# Patient Record
Sex: Female | Born: 1975 | Race: White | Hispanic: No | Marital: Single | Smoking: Never smoker
Health system: Southern US, Community
[De-identification: ages and names within clinical notes are randomized; demographics above are authoritative.]

## PROBLEM LIST (undated history)

## (undated) DIAGNOSIS — K589 Irritable bowel syndrome without diarrhea: Secondary | ICD-10-CM

## (undated) DIAGNOSIS — R112 Nausea with vomiting, unspecified: Secondary | ICD-10-CM

## (undated) DIAGNOSIS — Z9889 Other specified postprocedural states: Secondary | ICD-10-CM

## (undated) DIAGNOSIS — K219 Gastro-esophageal reflux disease without esophagitis: Secondary | ICD-10-CM

## (undated) DIAGNOSIS — L989 Disorder of the skin and subcutaneous tissue, unspecified: Secondary | ICD-10-CM

## (undated) DIAGNOSIS — F419 Anxiety disorder, unspecified: Secondary | ICD-10-CM

## (undated) HISTORY — DX: Gastro-esophageal reflux disease without esophagitis: K21.9

## (undated) HISTORY — PX: TUBAL LIGATION: SHX77

---

## 1999-11-24 ENCOUNTER — Other Ambulatory Visit: Admission: RE | Admit: 1999-11-24 | Discharge: 1999-12-19 | Payer: Self-pay | Admitting: Family Medicine

## 2000-12-26 ENCOUNTER — Other Ambulatory Visit: Admission: RE | Admit: 2000-12-26 | Discharge: 2000-12-26 | Payer: Self-pay | Admitting: *Deleted

## 2000-12-26 ENCOUNTER — Other Ambulatory Visit: Admission: RE | Admit: 2000-12-26 | Discharge: 2000-12-26 | Payer: Self-pay | Admitting: Family Medicine

## 2002-01-27 ENCOUNTER — Other Ambulatory Visit: Admission: RE | Admit: 2002-01-27 | Discharge: 2002-01-27 | Payer: Self-pay | Admitting: Family Medicine

## 2002-05-24 ENCOUNTER — Emergency Department (HOSPITAL_COMMUNITY): Admission: EM | Admit: 2002-05-24 | Discharge: 2002-05-24 | Payer: Self-pay | Admitting: *Deleted

## 2003-02-02 ENCOUNTER — Other Ambulatory Visit: Admission: RE | Admit: 2003-02-02 | Discharge: 2003-02-02 | Payer: Self-pay | Admitting: Family Medicine

## 2003-03-02 ENCOUNTER — Ambulatory Visit (HOSPITAL_COMMUNITY): Admission: RE | Admit: 2003-03-02 | Discharge: 2003-03-02 | Payer: Self-pay | Admitting: Family Medicine

## 2003-03-02 ENCOUNTER — Encounter: Payer: Self-pay | Admitting: Family Medicine

## 2003-03-11 ENCOUNTER — Ambulatory Visit (HOSPITAL_COMMUNITY): Admission: RE | Admit: 2003-03-11 | Discharge: 2003-03-11 | Payer: Self-pay | Admitting: Family Medicine

## 2003-03-11 ENCOUNTER — Encounter: Payer: Self-pay | Admitting: Family Medicine

## 2003-04-12 ENCOUNTER — Ambulatory Visit (HOSPITAL_COMMUNITY): Admission: RE | Admit: 2003-04-12 | Discharge: 2003-04-12 | Payer: Self-pay | Admitting: Internal Medicine

## 2003-04-12 ENCOUNTER — Encounter: Payer: Self-pay | Admitting: Internal Medicine

## 2003-07-19 ENCOUNTER — Other Ambulatory Visit: Admission: RE | Admit: 2003-07-19 | Discharge: 2003-07-19 | Payer: Self-pay | Admitting: Family Medicine

## 2004-05-09 ENCOUNTER — Ambulatory Visit (HOSPITAL_COMMUNITY): Admission: RE | Admit: 2004-05-09 | Discharge: 2004-05-09 | Payer: Self-pay | Admitting: Internal Medicine

## 2007-06-03 ENCOUNTER — Ambulatory Visit: Payer: Self-pay | Admitting: Internal Medicine

## 2008-04-02 DIAGNOSIS — K219 Gastro-esophageal reflux disease without esophagitis: Secondary | ICD-10-CM

## 2008-04-02 DIAGNOSIS — K589 Irritable bowel syndrome without diarrhea: Secondary | ICD-10-CM

## 2011-04-24 NOTE — Assessment & Plan Note (Signed)
[facility]                         GASTROENTEROLOGY OFFICE NOTE   NAME:Alyssa Arellano                     MRN:          [id number]  DATE:06/03/2007                            DOB:          [birth year]    HISTORY:  Alyssa Arellano presents today for followup.  She has not been in  this office for a little over three years.  She has a history of  functional GI complaints, including irritable bowel syndrome.  Also a  history of reflux disease and problems with anxiety.  She was last  evaluated on May 01, 2004 for a nonspecific nausea.  A solid phase  gastric emptying scan was normal.  She has not been seen since.   For her reflux, she continues on Prevacid 30 mg daily, occasionally  twice daily.  On medication, no significant symptoms.  Off medication,  significant breakthrough.  No dysphagia.  No vomiting.   Her lower abdominal complaints and change in bowel habits have been  stable as long as she uses Librax.  She generally uses this before  meals, occasionally at night.  She requests a refill of both GI  medications.  Since her last visit, she has had a cesarean section and a  tubal ligation.  No other problems.  She had been on antidepressants but  has not been for some time.   PAST MEDICAL HISTORY:  As above.   PAST SURGICAL HISTORY:  1. Cesarean section.  2. Tubal ligation.   ALLERGIES:  No known drug allergies.   CURRENT MEDICATIONS:  1. Prevacid 30 mg once or twice daily.  2. Librax 1 p.o. q.a.c. and nightly p.r.n.   FAMILY HISTORY:  Negative for gastrointestinal malignancy or  inflammatory bowel disease.   SOCIAL HISTORY:  Patient is single with two children.  Lives with her  boyfriend and children.  She is a Futures trader.  Does not smoke or use  alcohol.   REVIEW OF SYSTEMS:  Per diagnostic evaluation form.   PHYSICAL EXAMINATION:  GENERAL:  A well-appearing female in no acute  distress.  VITAL SIGNS:  Blood pressure is 108/74, heart  rate 72.  Weight is 198  pounds.  She is 5 feet 3 inches in height.  HEENT:  Sclerae are anicteric.  Conjunctivae are pink.  Oral mucosa is  intact.  NECK:  No adenopathy.  LUNGS:  Clear.  HEART:  Regular.  ABDOMEN:  Soft without tenderness, mass, or hernia.  Good bowel sounds  heard.   IMPRESSION:  1. Gastroesophageal reflux disease without alarm symptoms.  The      symptoms controlled on proton pump inhibitor therapy.  2. Irritable bowel syndrome:  Stable on Librax.   RECOMMENDATIONS:  1. Continue Prevacid.  2. Continue Librax.  3. Resume general medical care with Dr. Morrie Sheldon.  4. GI followup p.r.n.     Wilhemina Bonito. Marina Goodell, MD  Electronically Signed    JNP/MedQ  DD: 06/03/2007  DT: 06/03/2007  Job #: [id number]   cc:   Alfredia Client, MD

## 2011-12-03 ENCOUNTER — Encounter: Payer: Self-pay | Admitting: *Deleted

## 2011-12-03 ENCOUNTER — Emergency Department (HOSPITAL_COMMUNITY): Payer: Medicaid Other

## 2011-12-03 ENCOUNTER — Emergency Department (HOSPITAL_COMMUNITY)
Admission: EM | Admit: 2011-12-03 | Discharge: 2011-12-03 | Disposition: A | Discharge status: Home / Self Care | Payer: Medicaid Other | Attending: Emergency Medicine | Admitting: Emergency Medicine

## 2011-12-03 DIAGNOSIS — J40 Bronchitis, not specified as acute or chronic: Secondary | ICD-10-CM

## 2011-12-03 DIAGNOSIS — J209 Acute bronchitis, unspecified: Secondary | ICD-10-CM | POA: Insufficient documentation

## 2011-12-03 DIAGNOSIS — Z87891 Personal history of nicotine dependence: Secondary | ICD-10-CM | POA: Insufficient documentation

## 2011-12-03 MED ORDER — ALBUTEROL SULFATE (5 MG/ML) 0.5% IN NEBU
5.0000 mg | INHALATION_SOLUTION | Freq: Once | RESPIRATORY_TRACT | Status: AC
  Administered 2011-12-03: 5 mg via RESPIRATORY_TRACT
  Filled 2011-12-03: qty 1

## 2011-12-03 MED ORDER — BENZONATATE 100 MG PO CAPS: 100.0000 mg | ORAL_CAPSULE | Freq: Three times a day (TID) | ORAL | Status: AC | PRN

## 2011-12-03 MED ORDER — IPRATROPIUM BROMIDE 0.02 % IN SOLN
0.5000 mg | Freq: Once | RESPIRATORY_TRACT | Status: AC
  Administered 2011-12-03: 0.5 mg via RESPIRATORY_TRACT
  Filled 2011-12-03: qty 2.5

## 2011-12-03 MED ORDER — AZITHROMYCIN 250 MG PO TABS: 250.0000 mg | ORAL_TABLET | Freq: Every day | ORAL | Status: AC

## 2011-12-03 MED ORDER — ALBUTEROL SULFATE HFA 108 (90 BASE) MCG/ACT IN AERS: 1.0000 | INHALATION_SPRAY | Freq: Four times a day (QID) | RESPIRATORY_TRACT | Status: DC | PRN

## 2011-12-03 MED ORDER — PREDNISONE 20 MG PO TABS: 60.0000 mg | ORAL_TABLET | Freq: Every day | ORAL | Status: DC

## 2011-12-03 NOTE — ED Provider Notes (Signed)
History     CSN: [id number]  Arrival date & time 12/03/11  4010   First MD Initiated Contact with Patient 12/03/11 [phone]      Chief Complaint  Patient presents with  . Cough    (Consider location/radiation/quality/duration/timing/severity/associated sxs/prior treatment) Patient is a 35 y.o. female presenting with cough. The history is provided by the patient.  Cough This is a new problem. Episode onset: 7 days ago. The problem occurs every few minutes. The problem has been gradually worsening. The cough is productive of sputum. The maximum temperature recorded prior to her arrival was 100 to 100.9 F. Associated symptoms include chills, sweats, rhinorrhea, sore throat, myalgias, shortness of breath and wheezing. Pertinent negatives include no chest pain. Treatments tried: She has taken otc cough and cold remedy without relief. The treatment provided no relief. She is not a smoker. Her past medical history does not include bronchitis, pneumonia or asthma.    History reviewed. No pertinent past medical history.  History reviewed. No pertinent past surgical history.  History reviewed. No pertinent family history.  History  Substance Use Topics  . Smoking status: Former Games developer  . Smokeless tobacco: Not on file  . Alcohol Use: No    OB History    Grav Para Term Preterm Abortions TAB SAB Ect Mult Living                  Review of Systems  Constitutional: Positive for chills.  HENT: Positive for congestion, sore throat and rhinorrhea.   Respiratory: Positive for cough, shortness of breath and wheezing.   Cardiovascular: Negative for chest pain.  Musculoskeletal: Positive for myalgias.    Allergies  Penicillins  Home Medications   Current Outpatient Rx  Name Route Sig Dispense Refill  . ALBUTEROL SULFATE HFA 108 (90 BASE) MCG/ACT IN AERS Inhalation Inhale 1-2 puffs into the lungs every 6 (six) hours as needed for wheezing. 1 Inhaler 0  . AZITHROMYCIN 250 MG PO TABS Oral  Take 1 tablet (250 mg total) by mouth daily. Take first 2 tablets together, then 1 every day until finished. 6 tablet 0  . BENZONATATE 100 MG PO CAPS Oral Take 1 capsule (100 mg total) by mouth 3 (three) times daily as needed for cough. 21 capsule 0  . PREDNISONE 20 MG PO TABS Oral Take 3 tablets (60 mg total) by mouth daily. 12 tablet 0    BP 133/80  Pulse 94  Temp(Src) 98.5 F (36.9 C) (Oral)  Resp 18  Wt 206 lb (93.441 kg)  SpO2 98%  LMP 11/19/2011  Physical Exam  Nursing note and vitals reviewed. Constitutional: She is oriented to person, place, and time. She appears well-developed and well-nourished.  HENT:  Head: Normocephalic and atraumatic.  Eyes: Conjunctivae are normal.  Neck: Normal range of motion.  Cardiovascular: Normal rate, regular rhythm, normal heart sounds and intact distal pulses.   Pulmonary/Chest: She is in respiratory distress. She has wheezes in the right upper field and the left upper field. She has no rhonchi. She has no rales.  Abdominal: Soft. Bowel sounds are normal. There is no tenderness.  Musculoskeletal: Normal range of motion.  Neurological: She is alert and oriented to person, place, and time.  Skin: Skin is warm and dry.  Psychiatric: She has a normal mood and affect.    ED Course  Procedures (including critical care time)  Labs Reviewed - No data to display Dg Chest 2 View  12/03/2011  *RADIOLOGY REPORT*  Clinical Data: Cough  and wheezing  CHEST - 2 VIEW  Comparison: None.  Findings: Diffusely increased interstitial lung markings noted, without focal pulmonary opacity.  Heart size is normal.  No pleural effusion.  No acute osseous finding.  IMPRESSION: Diffusely increased interstitial lung markings noted, without focal pulmonary opacity.  This could be due to atypical/viral etiologies.  Original Report Authenticated By: Harrel Lemon, M.D.     1. Bronchitis       MDM  Bronchitis with bronchospasm.  Albuterol given with improved  aeration,  Wheezing resolved.  Will tx with albuterol mdi,  Short pulse dose prednisone ,  Tessalon, zithromax to cover for possible atypical infection.        Candis Musa, PA 12/03/11 1142

## 2011-12-03 NOTE — ED Notes (Signed)
Pt c/o nasal congestion, cough, headache, fever, right earache and sore throat since Tuesday. Pt states that she was seen by her MD on Wednesday and told that she had a virus. Pt had negative flu test.

## 2011-12-04 NOTE — ED Provider Notes (Signed)
Medical screening examination/treatment/procedure(s) were performed by non-physician practitioner and as supervising physician I was immediately available for consultation/collaboration.   Shelda Jakes, MD 12/04/11 [phone]

## 2013-06-16 ENCOUNTER — Telehealth: Payer: Self-pay | Admitting: Family Medicine

## 2013-06-16 NOTE — Telephone Encounter (Signed)
APT MADE 

## 2013-06-17 ENCOUNTER — Encounter: Payer: Self-pay | Admitting: Nurse Practitioner

## 2013-06-17 ENCOUNTER — Encounter (HOSPITAL_COMMUNITY): Payer: Medicaid Other

## 2013-06-17 ENCOUNTER — Ambulatory Visit (INDEPENDENT_AMBULATORY_CARE_PROVIDER_SITE_OTHER): Payer: Medicaid Other | Admitting: Nurse Practitioner

## 2013-06-17 VITALS — BP 136/90 | HR 63 | Temp 97.0°F | Ht 65.0 in | Wt 206.0 lb

## 2013-06-17 DIAGNOSIS — N63 Unspecified lump in unspecified breast: Secondary | ICD-10-CM

## 2013-06-17 DIAGNOSIS — N6310 Unspecified lump in the right breast, unspecified quadrant: Secondary | ICD-10-CM

## 2013-06-17 NOTE — Patient Instructions (Addendum)
Breast Self-Examination You should begin examining your breasts at age 35 even though the risk for breast cancer is low in this age group. It is important to become familiar with how your breasts look and feel. This is true for pregnant women, nursing mothers, women in menopause and women who have breast implants.  Women should examine their breasts once a month to look for changes and lumps. By doing monthly breast exams, you get to know how your breasts feel and how they can change from month to month. This allows you to pick up changes early. It can also offer you some reassurance that your breast health is good. This exam only takes minutes. Most breast lumps are not caused by cancer. If you find a lump, a special x-ray called a mammogram, or other tests may be needed to determine what is wrong.  Some of the signs that a breast lump is caused by cancer include:  Dimpling of the skin or changes in the shape of the breast or nipple.   A dark-colored or bloody discharge from the nipple.   Swollen lymph glands around the breast or in the armpit.   Redness of the breast or nipple.   Scaly nipple or skin on the breast.   Pain or swelling of the breast.  SELF-EXAM There are a few points to follow when doing a thorough breast exam. The best time to examine your breasts is 5 to 7 days after the menstrual period is over. During menstruation, the breasts are lumpier, and it may be more difficult to pick up changes. If you do not menstruate, have reached menopause or had a hysterectomy, examine your breasts the first day of every month. After three to four months, you will become more familiar with the variations of your breasts and more comfortable with the exam.  Perform your breast exam monthly. Keep a written record with breast changes or normal findings for each breast. This makes it easier to be sure of changes and to not solely depend on memory for size, tenderness, or location. Try to do the exam  at the same time each month, and write down where you are in your menstrual cycle if you are still menstruating.   Look at your breasts. Stand in front of a mirror with your hands clasped behind your head. Tighten your chest muscles and look for asymmetry. This means a difference in shape or contour from one breast to the other, such as puckers, dips or bumps. Look also for skin changes.   Lean forward with your hands on your hips. Again, look for symmetry and skin changes.   While showering, soap the breasts, and carefully feel the breasts with fingertips while holding the arm (on the side of the breast being examined) over the head. Do this with each breast carefully feeling for lumps or changes. Typically, a circular motion with moderate fingertip pressure should be used.   Repeat this exam while lying on your back, again with your arm behind your head and a pillow under your shoulders. Again, use your fingertips to examine both breasts, feeling for lumps and thickening. Begin at 1 o'clock and go clockwise around the whole breast.   At the end of your exam, gently squeeze each nipple to see if there is any drainage. Look for nipple changes, dimpling or redness.   Lastly, examine the upper chest and clavicle areas and in your armpits.  It is not necessary to become alarmed if you find   a breast lump. Most of them are not cancerous. However, it is necessary to see your caregiver if a lump is found in order to have it looked at. Document Released: 01/03/2005 Document Revised: 08/08/2011 Document Reviewed: 03/15/2009 ExitCare Patient Information 2012 [employer]. 

## 2013-06-17 NOTE — Progress Notes (Signed)
  Subjective:    Patient ID: Alyssa Arellano, female    DOB: 1976-05-18, 37 y.o.   MRN: [id number]  HPI Patient in saying that she felt a knot in right breast and was sore to touch- noticed it about 1 week ago- still there- No nipple discharge- no dimpling No family history of breast cancer.    Review of Systems  All other systems reviewed and are negative.       Objective:   Physical Exam  Constitutional: She appears well-developed and well-nourished.  Cardiovascular: Normal rate and normal heart sounds.   Pulmonary/Chest: Effort normal and breath sounds normal. Right breast exhibits mass (2cm 2 o'clock position right aerola). Right breast exhibits no nipple discharge, no skin change and no tenderness. Left breast exhibits no inverted nipple, no mass, no nipple discharge, no skin change and no tenderness. Breasts are symmetrical.        BP 136/90  Pulse 63  Temp(Src) 97 F (36.1 C) (Oral)  Ht 5\' 5"  (1.651 m)  Wt 206 lb (93.441 kg)  BMI 34.28 kg/m2  LMP 05/28/2013     Assessment & Plan:  1. Breast mass, right Keep watch of area until mammogram is scheduled - [facility]; Future  Mary-Margaret Daphine Deutscher, FNP

## 2013-06-24 ENCOUNTER — Ambulatory Visit (HOSPITAL_COMMUNITY)
Admission: RE | Admit: 2013-06-24 | Discharge: 2013-06-24 | Disposition: A | Discharge status: Home / Self Care | Payer: Medicaid Other | Source: Ambulatory Visit | Attending: Nurse Practitioner | Admitting: Nurse Practitioner

## 2013-06-24 DIAGNOSIS — N6310 Unspecified lump in the right breast, unspecified quadrant: Secondary | ICD-10-CM

## 2013-06-24 DIAGNOSIS — N63 Unspecified lump in unspecified breast: Secondary | ICD-10-CM | POA: Insufficient documentation

## 2013-10-13 ENCOUNTER — Ambulatory Visit (INDEPENDENT_AMBULATORY_CARE_PROVIDER_SITE_OTHER): Payer: Medicaid Other | Admitting: General Practice

## 2013-10-13 ENCOUNTER — Encounter: Payer: Self-pay | Admitting: General Practice

## 2013-10-13 ENCOUNTER — Encounter (INDEPENDENT_AMBULATORY_CARE_PROVIDER_SITE_OTHER): Payer: Self-pay

## 2013-10-13 VITALS — BP 128/87 | HR 64 | Temp 98.5°F | Wt 208.0 lb

## 2013-10-13 DIAGNOSIS — M79609 Pain in unspecified limb: Secondary | ICD-10-CM

## 2013-10-13 DIAGNOSIS — M79671 Pain in right foot: Secondary | ICD-10-CM

## 2013-10-13 DIAGNOSIS — M25879 Other specified joint disorders, unspecified ankle and foot: Secondary | ICD-10-CM

## 2013-10-13 MED ORDER — IBUPROFEN 800 MG PO TABS: 800.0000 mg | ORAL_TABLET | Freq: Three times a day (TID) | ORAL | Status: DC | PRN

## 2013-10-13 NOTE — Patient Instructions (Addendum)
Ganglion Cyst °A ganglion cyst is a noncancerous, fluid-filled lump that occurs near joints or tendons. The ganglion cyst grows out of a joint or the lining of a tendon. It most often develops in the hand or wrist but can also develop in the shoulder, elbow, hip, knee, ankle, or foot. The round or oval ganglion can be pea sized or larger than a grape. Increased activity may enlarge the size of the cyst because more fluid starts to build up.  °CAUSES  °It is not completely known what causes a ganglion cyst to grow. However, it may be related to: °· Inflammation or irritation around the joint. °· An injury. °· Repetitive movements or overuse. °· Arthritis. °SYMPTOMS  °A lump most often appears in the hand or wrist, but can occur in other areas of the body. Generally, the lump is painless without other symptoms. However, sometimes pain can be felt during activity or when pressure is applied to the lump. The lump may even be tender to the touch. Tingling, pain, numbness, or muscle weakness can occur if the ganglion cyst presses on a nerve. Your grip may be weak and you may have less movement in your joints.  °DIAGNOSIS  °Ganglion cysts are most often diagnosed based on a physical exam, noting where the cyst is and how it looks. Your caregiver will feel the lump and may shine a light alongside it. If it is a ganglion, a light often shines through it. Your caregiver may order an X-ray, ultrasound, or MRI to rule out other conditions. °TREATMENT  °Ganglions usually go away on their own without treatment. If pain or other symptoms are involved, treatment may be needed. Treatment is also needed if the ganglion limits your movement or if it gets infected. Treatment options include: °· Wearing a wrist or finger brace or splint. °· Taking anti-inflammatory medicine. °· Draining fluid from the lump with a needle (aspiration). °· Injecting a steroid into the joint. °· Surgery to remove the ganglion cyst and its stalk that is  attached to the joint or tendon. However, ganglion cysts can grow back. °HOME CARE INSTRUCTIONS  °· Do not press on the ganglion, poke it with a needle, or hit it with a heavy object. You may rub the lump gently and often. Sometimes fluid moves out of the cyst. °· Only take medicines as directed by your caregiver. °· Wear your brace or splint as directed by your caregiver. °SEEK MEDICAL CARE IF:  °· Your ganglion becomes larger or more painful. °· You have increased redness, red streaks, or swelling. °· You have pus coming from the lump. °· You have weakness or numbness in the affected area. °MAKE SURE YOU:  °· Understand these instructions. °· Will watch your condition. °· Will get help right away if you are not doing well or get worse. °Document Released: 11/23/2000 Document Revised: 08/20/2012 Document Reviewed: 01/20/2008 °ExitCare® Patient Information ©2014 [employer]. ° °

## 2013-10-13 NOTE — Progress Notes (Signed)
  Subjective:    Patient ID: Alyssa Arellano, female    DOB: 09-Dec-1976, 37 y.o.   MRN: [id number]  HPI Patient presents today complaining of cyst on right foot. She reports onset was two years ago and has gradually gotten larger. Rates her pain at times as 7 out of 10.  She denies OTC medications or home remedies.    Review of Systems  Respiratory: Negative for chest tightness and shortness of breath.   Cardiovascular: Negative for chest pain, palpitations and leg swelling.  Musculoskeletal:       Knot on right foot  Neurological: Negative for dizziness, weakness and numbness.       Objective:   Physical Exam  Constitutional: She is oriented to person, place, and time. She appears well-developed and well-nourished.  Cardiovascular: Normal rate, regular rhythm and normal heart sounds.   Pulmonary/Chest: Effort normal and breath sounds normal. No respiratory distress. She exhibits no tenderness.  Musculoskeletal: She exhibits tenderness.  Right foot ganglian cyst, size of small grape  Neurological: She is alert and oriented to person, place, and time.  Skin: Skin is warm and dry.  Psychiatric: She has a normal mood and affect.          Assessment & Plan:  1. Right foot pain  - ibuprofen (ADVIL,MOTRIN) 800 MG tablet; Take 1 tablet (800 mg total) by mouth every 8 (eight) hours as needed for pain.  Dispense: 30 tablet; Refill: 0  2. Cyst of joint of ankle or foot  - ibuprofen (ADVIL,MOTRIN) 800 MG tablet; Take 1 tablet (800 mg total) by mouth every 8 (eight) hours as needed for pain.  Dispense: 30 tablet; Refill: 0 - Ambulatory referral to Orthopedic Surgery -RTO if symptoms worsen or unresolved -Patient verbalized understanding -Coralie Keens, FNP-C

## 2013-11-11 ENCOUNTER — Ambulatory Visit (INDEPENDENT_AMBULATORY_CARE_PROVIDER_SITE_OTHER): Payer: Medicaid Other

## 2013-11-11 ENCOUNTER — Ambulatory Visit (INDEPENDENT_AMBULATORY_CARE_PROVIDER_SITE_OTHER): Payer: Medicaid Other | Admitting: Orthopedic Surgery

## 2013-11-11 ENCOUNTER — Encounter: Payer: Self-pay | Admitting: Orthopedic Surgery

## 2013-11-11 VITALS — BP 122/96 | Ht 65.0 in | Wt 204.0 lb

## 2013-11-11 DIAGNOSIS — M79671 Pain in right foot: Secondary | ICD-10-CM

## 2013-11-11 DIAGNOSIS — M79609 Pain in unspecified limb: Secondary | ICD-10-CM

## 2013-11-11 NOTE — Progress Notes (Signed)
   Subjective:    Patient ID: Alyssa Arellano, female    DOB: 11/11/1976, 37 y.o.   MRN: [id number]  HPI Comments: Presents with a mass over the dorsum of her right foot which has been present for 2 years. She complains of sharp 7/10 intermittent pain which is worse after standing area she also complains of some unrelated ankle swelling but that's also noted after swelling and is unilateral  Review of systems heartburn and diarrhea from irritable bowel syndrome she also complains of nervousness and her other systems were reviewed and they were normal  She is allergic to penicillin  Medical problem IBS  Family history cancer diabetes  Social history single  Not working  No smoking or drinking    Foot Pain      Review of Systems     Objective:   Physical Exam  Constitutional: She is oriented to person, place, and time. Vital signs are normal. She appears well-developed and well-nourished.  HENT:  Head: Normocephalic.  Musculoskeletal:       Right shoulder: Normal.       Left shoulder: Normal.       Right ankle: Normal.       Left ankle: Normal.       Right foot: She exhibits tenderness and swelling. She exhibits normal range of motion, no bony tenderness, normal capillary refill, no crepitus, no deformity and no laceration.       Feet:  Neurological: She is alert and oriented to person, place, and time. She has normal strength. She displays no atrophy and no tremor. No sensory deficit. She exhibits normal muscle tone. Gait normal.  Reflex Scores:      Achilles reflexes are 2+ on the right side. Skin: Skin is warm, dry and intact. No abrasion noted. Rash is not urticarial. No cyanosis.  Psychiatric: She has a normal mood and affect. Her speech is normal and behavior is normal. Judgment and thought content normal.          Assessment & Plan:  The patient is a dorsal mass over the right foot consistent with a tender ganglion cyst it's inhibiting her shoe wear and  causing her pain  She would like the mass removed from the right foot  X-ray does show some mild degenerative changes in the midfoot  Minimal risk of surgery at this point. She understands her to take it easy for 2 weeks and to get her sutures out recurrence right lobe.

## 2013-11-16 ENCOUNTER — Other Ambulatory Visit: Payer: Self-pay | Admitting: *Deleted

## 2013-12-08 ENCOUNTER — Telehealth: Payer: Self-pay | Admitting: Orthopedic Surgery

## 2013-12-08 NOTE — Telephone Encounter (Signed)
Regarding out-patient surgery scheduled at [facility] 01/17/2014, CPT 28090, ICD-9 codes 729.5, 727.43 - per insurer Medicaid's 3rd party contact [place] [employer], online request system indicates no pre-authorization required for these codes.

## 2013-12-09 ENCOUNTER — Encounter (HOSPITAL_COMMUNITY): Payer: Self-pay | Admitting: Pharmacy Technician

## 2013-12-11 NOTE — Patient Instructions (Signed)
Geryl RankinsSamantha G Palmisano  12/11/2013   Your procedure is scheduled on:  FRIDAY, JANUARY 9  Report [facility] at 1030[facility].  Call this number if you have problems the morning of surgery: [phone]   Remember:   Do not eat food or drink liquids after midnight.   Take these medicines the morning of surgery with A SIP OF WATER: None   Do not wear jewelry, make-up or nail polish.  Do not wear lotions, powders, or perfumes. You may wear deodorant.  Do not shave 48 hours prior to surgery. Men may shave face and neck.  Do not bring valuables to the hospital.  [facility] not [facility]  for any belongings or valuables.               Contacts, dentures or bridgework may not be worn into surgery.  Leave suitcase in the car. After surgery it may be brought to your room.  For patients admitted to the hospital, discharge time is determined by your treatment team.               Patients discharged the day of surgery will not be allowed to drive  home.  Name and phone number of your driver: Family or friend  Special Instructions: Shower using CHG 2 nights before surgery and the night before surgery.  If you shower the day of surgery use CHG.  Use special wash - you have one bottle of CHG for all showers.  You should use approximately 1/3 of the bottle for each shower.   Please read over the following fact sheets that you were given: Pain Booklet, Coughing and Deep Breathing, MRSA Information, Surgical Site Infection Prevention, Anesthesia Post-op Instructions and Care and Recovery After Surgery  PATIENT INSTRUCTIONS POST-ANESTHESIA  IMMEDIATELY FOLLOWING SURGERY:  Do not drive or operate machinery for the first twenty four hours after surgery.  Do not make any important decisions for twenty four hours after surgery or while taking narcotic pain medications or sedatives.  If you develop intractable nausea and vomiting or a severe headache please notify your doctor immediately.  FOLLOW-UP:  Please make  an appointment with your surgeon as instructed. You do not need to follow up with anesthesia unless specifically instructed to do so.  WOUND CARE INSTRUCTIONS (if applicable):  Keep a dry clean dressing on the anesthesia/puncture wound site if there is drainage.  Once the wound has quit draining you may leave it open to air.  Generally you should leave the bandage intact for twenty four hours unless there is drainage.  If the epidural site drains for more than 36-48 hours please call the anesthesia department.  QUESTIONS?:  Please feel free to call your physician or the hospital operator if you have any questions, and they will be happy to assist you.      Ganglion Cyst A ganglion cyst is a noncancerous, fluid-filled lump that occurs near joints or tendons. The ganglion cyst grows out of a joint or the lining of a tendon. It most often develops in the hand or wrist but can also develop in the shoulder, elbow, hip, knee, ankle, or foot. The round or oval ganglion can be pea sized or larger than a grape. Increased activity may enlarge the size of the cyst because more fluid starts to build up.  CAUSES  It is not completely known what causes a ganglion cyst to grow. However, it may be related to:  Inflammation or irritation around the joint.  An  injury.  Repetitive movements or overuse.  Arthritis. SYMPTOMS  A lump most often appears in the hand or wrist, but can occur in other areas of the body. Generally, the lump is painless without other symptoms. However, sometimes pain can be felt during activity or when pressure is applied to the lump. The lump may even be tender to the touch. Tingling, pain, numbness, or muscle weakness can occur if the ganglion cyst presses on a nerve. Your grip may be weak and you may have less movement in your joints.  DIAGNOSIS  Ganglion cysts are most often diagnosed based on a physical exam, noting where the cyst is and how it looks. Your caregiver will feel the lump  and may shine a light alongside it. If it is a ganglion, a light often shines through it. Your caregiver may order an X-ray, ultrasound, or MRI to rule out other conditions. TREATMENT  Ganglions usually go away on their own without treatment. If pain or other symptoms are involved, treatment may be needed. Treatment is also needed if the ganglion limits your movement or if it gets infected. Treatment options include:  Wearing a wrist or finger brace or splint.  Taking anti-inflammatory medicine.  Draining fluid from the lump with a needle (aspiration).  Injecting a steroid into the joint.  Surgery to remove the ganglion cyst and its stalk that is attached to the joint or tendon. However, ganglion cysts can grow back. HOME CARE INSTRUCTIONS   Do not press on the ganglion, poke it with a needle, or hit it with a heavy object. You may rub the lump gently and often. Sometimes fluid moves out of the cyst.  Only take medicines as directed by your caregiver.  Wear your brace or splint as directed by your caregiver. SEEK MEDICAL CARE IF:   Your ganglion becomes larger or more painful.  You have increased redness, red streaks, or swelling.  You have pus coming from the lump.  You have weakness or numbness in the affected area. MAKE SURE YOU:   Understand these instructions.  Will watch your condition.  Will get help right away if you are not doing well or get worse. Document Released: 11/23/2000 Document Revised: 08/20/2012 Document Reviewed: 01/20/2008 [facility] Patient Information 2014 [place], [place].

## 2013-12-14 ENCOUNTER — Encounter (HOSPITAL_COMMUNITY)
Admission: RE | Admit: 2013-12-14 | Discharge: 2013-12-14 | Disposition: A | Discharge status: Home / Self Care | Payer: Medicaid Other | Source: Ambulatory Visit | Attending: Orthopedic Surgery | Admitting: Orthopedic Surgery

## 2013-12-14 ENCOUNTER — Encounter (HOSPITAL_COMMUNITY): Payer: Self-pay

## 2013-12-14 DIAGNOSIS — Z01812 Encounter for preprocedural laboratory examination: Secondary | ICD-10-CM | POA: Insufficient documentation

## 2013-12-14 DIAGNOSIS — Z01818 Encounter for other preprocedural examination: Secondary | ICD-10-CM | POA: Insufficient documentation

## 2013-12-14 HISTORY — DX: Other specified postprocedural states: R11.2

## 2013-12-14 HISTORY — DX: Other specified postprocedural states: Z98.890

## 2013-12-14 HISTORY — DX: Irritable bowel syndrome, unspecified: K58.9

## 2013-12-14 HISTORY — DX: Anxiety disorder, unspecified: F41.9

## 2013-12-14 HISTORY — DX: Disorder of the skin and subcutaneous tissue, unspecified: L98.9

## 2013-12-14 LAB — BASIC METABOLIC PANEL
BUN: 10 mg/dL (ref 6–23)
CO2: 25 mEq/L (ref 19–32)
Calcium: 9.4 mg/dL (ref 8.4–10.5)
Chloride: 103 mEq/L (ref 96–112)
Creatinine, Ser: 0.75 mg/dL (ref 0.50–1.10)
GLUCOSE: 99 mg/dL (ref 70–99)
POTASSIUM: 4 meq/L (ref 3.7–5.3)
SODIUM: 141 meq/L (ref 137–147)

## 2013-12-14 LAB — HCG, SERUM, QUALITATIVE: PREG SERUM: NEGATIVE

## 2013-12-14 LAB — HEMOGLOBIN AND HEMATOCRIT, BLOOD
HEMATOCRIT: 43.6 % (ref 36.0–46.0)
HEMOGLOBIN: 14.6 g/dL (ref 12.0–15.0)

## 2013-12-17 NOTE — H&P (Signed)
Alyssa Arellano is an 38 y.o. female.   Chief Complaint: Pain right foot with mass  HPI:  Subjective:     Patient ID: Alyssa RankinsSamantha G Rudman, female    DOB: 01/29/1976, 38 y.o.   MRN: [id number]  HPI Comments: Presents with a mass over the dorsum of her right foot which has been present for 2 years. She complains of sharp 7/10 intermittent pain which is worse after standing area she also complains of some unrelated ankle swelling but that's also noted after swelling and is unilateral   She is allergic to penicillin  Medical problem IBS  Family history cancer diabetes  Social history single  Not working  No smoking or drinking      Past Medical History  Diagnosis Date  . GERD (gastroesophageal reflux disease)   . IBS (irritable bowel syndrome)   . PONV (postoperative nausea and vomiting)   . Skin disorder     possibly psoriosis, but pt is unsure. Causes red, spotchy, dry spots on body when it gets cold.  . Anxiety     Past Surgical History  Procedure Laterality Date  . Cesarean section  2006    [facility]    No family history on file. Social History:  reports that she has never smoked. She does not have any smokeless tobacco history on file. She reports that she does not drink alcohol or use illicit drugs.  Allergies:  Allergies  Allergen Reactions  . Penicillins     No prescriptions prior to admission    No results found for this or any previous visit (from the past 48 hour(s)). No results found.  ROS Review of systems heartburn and diarrhea from irritable bowel syndrome she also complains of nervousness and her other systems were reviewed and they were normal  There were no vitals taken for this visit. Physical Exam    Objective:    Physical Exam  Constitutional: She is oriented to person, place, and time. Vital signs are normal. She appears well-developed and well-nourished.  HENT:   Head: Normocephalic.  Musculoskeletal:       Right shoulder:  Normal.       Left shoulder: Normal.       Right ankle: Normal.       Left ankle: Normal.       Right foot: She exhibits tenderness and swelling. She exhibits normal range of motion, no bony tenderness, normal capillary refill, no crepitus, no deformity and no laceration.       Feet: graphic Neurological: She is alert and oriented to person, place, and time. She has normal strength. She displays no atrophy and no tremor. No sensory deficit. She exhibits normal muscle tone. Gait normal.   Reflex Scores:      Achilles reflexes are 2+ on the right side. Skin: Skin is warm, dry and intact. No abrasion noted. Rash is not urticarial. No cyanosis.  Psychiatric: She has a normal mood and affect. Her speech is normal and behavior is normal. Judgment and thought content normal.         Assessment & Plan:   The patient is a dorsal mass over the right foot consistent with a tender ganglion cyst it's inhibiting her shoe wear and causing her pain  She would like the mass removed from the right foot  X-ray does show some mild degenerative changes in the midfoot  Minimal risk of surgery at this point. She understands her to take it easy for 2 weeks and  to get her sutures out recurrence right lobe.  Fuller Canada 12/17/2013, 9:18 AM

## 2013-12-18 ENCOUNTER — Encounter (HOSPITAL_COMMUNITY): Payer: Self-pay | Admitting: *Deleted

## 2013-12-18 ENCOUNTER — Ambulatory Visit (HOSPITAL_COMMUNITY): Payer: Medicaid Other | Admitting: Anesthesiology

## 2013-12-18 ENCOUNTER — Encounter (HOSPITAL_COMMUNITY)
Admission: RE | Disposition: A | Discharge status: Home / Self Care | Payer: Self-pay | Source: Ambulatory Visit | Attending: Orthopedic Surgery

## 2013-12-18 ENCOUNTER — Ambulatory Visit (HOSPITAL_COMMUNITY)
Admission: RE | Admit: 2013-12-18 | Discharge: 2013-12-18 | Disposition: A | Discharge status: Home / Self Care | Payer: Medicaid Other | Source: Ambulatory Visit | Attending: Orthopedic Surgery | Admitting: Orthopedic Surgery

## 2013-12-18 ENCOUNTER — Encounter (HOSPITAL_COMMUNITY): Payer: Medicaid Other | Admitting: Anesthesiology

## 2013-12-18 DIAGNOSIS — M722 Plantar fascial fibromatosis: Secondary | ICD-10-CM | POA: Insufficient documentation

## 2013-12-18 DIAGNOSIS — M6740 Ganglion, unspecified site: Secondary | ICD-10-CM

## 2013-12-18 DIAGNOSIS — R229 Localized swelling, mass and lump, unspecified: Secondary | ICD-10-CM | POA: Insufficient documentation

## 2013-12-18 HISTORY — PX: GANGLION CYST EXCISION: SHX1691

## 2013-12-18 SURGERY — EXCISION, GANGLION CYST, FOOT
Anesthesia: Monitor Anesthesia Care | Laterality: Right

## 2013-12-18 MED ORDER — LIDOCAINE HCL (PF) 1 % IJ SOLN
INTRAMUSCULAR | Status: AC
  Filled 2013-12-18: qty 30

## 2013-12-18 MED ORDER — CHLORHEXIDINE GLUCONATE 4 % EX LIQD: 60.0000 mL | Freq: Once | CUTANEOUS | Status: DC

## 2013-12-18 MED ORDER — BUPIVACAINE HCL (PF) 0.5 % IJ SOLN
INTRAMUSCULAR | Status: AC
  Filled 2013-12-18: qty 30

## 2013-12-18 MED ORDER — ONDANSETRON HCL 4 MG/2ML IJ SOLN
INTRAMUSCULAR | Status: AC
  Filled 2013-12-18: qty 2

## 2013-12-18 MED ORDER — MIDAZOLAM HCL 2 MG/2ML IJ SOLN
1.0000 mg | INTRAMUSCULAR | Status: DC | PRN
Start: 2013-12-18 — End: 2013-12-18
  Administered 2013-12-18: 2 mg via INTRAVENOUS

## 2013-12-18 MED ORDER — ONDANSETRON HCL 4 MG/2ML IJ SOLN: 4.0000 mg | Freq: Once | INTRAMUSCULAR | Status: DC | PRN

## 2013-12-18 MED ORDER — LIDOCAINE HCL (PF) 1 % IJ SOLN
INTRAMUSCULAR | Status: AC
Start: 2013-12-18 — End: 2013-12-18
  Filled 2013-12-18: qty 5

## 2013-12-18 MED ORDER — FENTANYL CITRATE 0.05 MG/ML IJ SOLN
INTRAMUSCULAR | Status: AC
  Filled 2013-12-18: qty 2

## 2013-12-18 MED ORDER — FENTANYL CITRATE 0.05 MG/ML IJ SOLN
25.0000 ug | INTRAMUSCULAR | Status: AC
  Administered 2013-12-18: 25 ug via INTRAVENOUS

## 2013-12-18 MED ORDER — PROPOFOL INFUSION 10 MG/ML OPTIME
INTRAVENOUS | Status: DC | PRN
  Administered 2013-12-18: 125 ug/kg/min via INTRAVENOUS

## 2013-12-18 MED ORDER — MIDAZOLAM HCL 2 MG/2ML IJ SOLN
INTRAMUSCULAR | Status: AC
  Filled 2013-12-18: qty 2

## 2013-12-18 MED ORDER — LACTATED RINGERS IV SOLN
INTRAVENOUS | Status: DC
  Administered 2013-12-18: 11:00:00 via INTRAVENOUS

## 2013-12-18 MED ORDER — FENTANYL CITRATE 0.05 MG/ML IJ SOLN
INTRAMUSCULAR | Status: DC | PRN
  Administered 2013-12-18 (×3): 25 ug via INTRAVENOUS

## 2013-12-18 MED ORDER — HYDROCODONE-ACETAMINOPHEN 5-325 MG PO TABS: 1.0000 | ORAL_TABLET | ORAL | Status: DC | PRN

## 2013-12-18 MED ORDER — FENTANYL CITRATE 0.05 MG/ML IJ SOLN: 25.0000 ug | INTRAMUSCULAR | Status: DC | PRN

## 2013-12-18 MED ORDER — LIDOCAINE HCL (PF) 1 % IJ SOLN
INTRAMUSCULAR | Status: DC | PRN
  Administered 2013-12-18: 14 mL

## 2013-12-18 MED ORDER — MIDAZOLAM HCL 5 MG/5ML IJ SOLN
INTRAMUSCULAR | Status: DC | PRN
  Administered 2013-12-18: 0.5 mg via INTRAVENOUS
  Administered 2013-12-18: 1 mg via INTRAVENOUS
  Administered 2013-12-18: 0.5 mg via INTRAVENOUS

## 2013-12-18 MED ORDER — PROPOFOL 10 MG/ML IV BOLUS
INTRAVENOUS | Status: AC
Start: 2013-12-18 — End: 2013-12-18
  Filled 2013-12-18: qty 20

## 2013-12-18 MED ORDER — ONDANSETRON HCL 4 MG/2ML IJ SOLN
INTRAMUSCULAR | Status: DC | PRN
  Administered 2013-12-18: 4 mg via INTRAVENOUS

## 2013-12-18 MED ORDER — SODIUM CHLORIDE 0.9 % IR SOLN
Status: DC | PRN
  Administered 2013-12-18: 1000 mL

## 2013-12-18 MED ORDER — VANCOMYCIN HCL IN DEXTROSE 1-5 GM/200ML-% IV SOLN
1000.0000 mg | INTRAVENOUS | Status: AC
Start: 2013-12-18 — End: 2013-12-18
  Administered 2013-12-18: 1000 mg via INTRAVENOUS
  Filled 2013-12-18: qty 200

## 2013-12-18 SURGICAL SUPPLY — 35 items
BAG HAMPER (MISCELLANEOUS) ×3 IMPLANT
BANDAGE ELASTIC 4 VELCRO NS (GAUZE/BANDAGES/DRESSINGS) ×3 IMPLANT
BANDAGE ESMARK 4X12 BL STRL LF (DISPOSABLE) IMPLANT
BANDAGE GAUZE ELAST BULKY 4 IN (GAUZE/BANDAGES/DRESSINGS) ×3 IMPLANT
BLADE SURG 15 STRL LF DISP TIS (BLADE) ×1 IMPLANT
BLADE SURG 15 STRL SS (BLADE) ×2
BNDG ESMARK 4X12 BLUE STRL LF (DISPOSABLE)
CHLORAPREP W/TINT 26ML (MISCELLANEOUS) ×3 IMPLANT
CLOTH BEACON ORANGE TIMEOUT ST (SAFETY) ×3 IMPLANT
COVER LIGHT HANDLE STERIS (MISCELLANEOUS) ×12 IMPLANT
CUFF TOURNIQUET SINGLE 34IN LL (TOURNIQUET CUFF) ×3 IMPLANT
DECANTER SPIKE VIAL GLASS SM (MISCELLANEOUS) ×3 IMPLANT
DRSG XEROFORM 1X8 (GAUZE/BANDAGES/DRESSINGS) ×3 IMPLANT
ELECT NEEDLE TIP 2.8 STRL (NEEDLE) ×3 IMPLANT
ELECT REM PT RETURN 9FT ADLT (ELECTROSURGICAL) ×3
ELECTRODE REM PT RTRN 9FT ADLT (ELECTROSURGICAL) ×1 IMPLANT
FORMALIN 10 PREFIL 120ML (MISCELLANEOUS) ×3 IMPLANT
GLOVE BIOGEL PI IND STRL 7.0 (GLOVE) ×2 IMPLANT
GLOVE BIOGEL PI INDICATOR 7.0 (GLOVE) ×4
GLOVE SKINSENSE NS SZ8.0 LF (GLOVE) ×2
GLOVE SKINSENSE STRL SZ8.0 LF (GLOVE) ×1 IMPLANT
GLOVE SS BIOGEL STRL SZ 6.5 (GLOVE) ×1 IMPLANT
GLOVE SS N UNI LF 8.5 STRL (GLOVE) ×3 IMPLANT
GLOVE SUPERSENSE BIOGEL SZ 6.5 (GLOVE) ×2
GOWN STRL REIN XL XLG (GOWN DISPOSABLE) ×6 IMPLANT
KIT ROOM TURNOVER APOR (KITS) ×3 IMPLANT
MANIFOLD NEPTUNE II (INSTRUMENTS) ×3 IMPLANT
NEEDLE HYPO 21X1.5 SAFETY (NEEDLE) ×3 IMPLANT
NS IRRIG 1000ML POUR BTL (IV SOLUTION) ×3 IMPLANT
PACK BASIC LIMB (CUSTOM PROCEDURE TRAY) ×3 IMPLANT
PAD ARMBOARD 7.5X6 YLW CONV (MISCELLANEOUS) ×3 IMPLANT
SET BASIN LINEN APH (SET/KITS/TRAYS/PACK) ×3 IMPLANT
SPONGE GAUZE 4X4 12PLY (GAUZE/BANDAGES/DRESSINGS) ×3 IMPLANT
SUT ETHILON 3 0 FSL (SUTURE) ×3 IMPLANT
SYR CONTROL 10ML LL (SYRINGE) ×3 IMPLANT

## 2013-12-18 NOTE — Interval H&P Note (Signed)
History and Physical Interval Note:  12/18/2013 11:21 AM  Alyssa Arellano  has presented today for surgery, with the diagnosis of Ganglion cyst right foot  The various methods of treatment have been discussed with the patient and family. After consideration of risks, benefits and other options for treatment, the patient has consented to  Procedure(s): REMOVAL GANGLION CYST FOOT (Right) as a surgical intervention .  The patient's history has been reviewed, patient examined, no change in status, stable for surgery.  I have reviewed the patient's chart and labs.  Questions were answered to the patient's satisfaction.     Fuller CanadaStanley Mykeisha Dysert

## 2013-12-18 NOTE — Anesthesia Preprocedure Evaluation (Signed)
Anesthesia Evaluation  Patient identified by MRN, date of birth, ID band Patient awake    Reviewed: Allergy & Precautions, H&P , NPO status , Patient's Chart, lab work & pertinent test results  History of Anesthesia Complications (+) PONV and history of anesthetic complications  Airway Mallampati: II TM Distance: >3 FB     Dental  (+) Teeth Intact   Pulmonary  breath sounds clear to auscultation        Cardiovascular negative cardio ROS  Rhythm:Regular Rate:Normal     Neuro/Psych PSYCHIATRIC DISORDERS Anxiety    GI/Hepatic GERD- (IBS)  ,  Endo/Other    Renal/GU      Musculoskeletal   Abdominal   Peds  Hematology   Anesthesia Other Findings   Reproductive/Obstetrics                           Anesthesia Physical Anesthesia Plan  ASA: II  Anesthesia Plan: MAC   Post-op Pain Management:    Induction: Intravenous  Airway Management Planned: Simple Face Mask  Additional Equipment:   Intra-op Plan:   Post-operative Plan:   Informed Consent: I have reviewed the patients History and Physical, chart, labs and discussed the procedure including the risks, benefits and alternatives for the proposed anesthesia with the patient or authorized representative who has indicated his/her understanding and acceptance.     Plan Discussed with:   Anesthesia Plan Comments:         Anesthesia Quick Evaluation

## 2013-12-18 NOTE — Anesthesia Procedure Notes (Signed)
Procedure Name: MAC Date/Time: 12/18/2013 11:25 AM Performed by: Franco NonesYATES, Darryll Raju S Pre-anesthesia Checklist: Patient identified, Emergency Drugs available, Suction available, Timeout performed and Patient being monitored Patient Re-evaluated:Patient Re-evaluated prior to inductionOxygen Delivery Method: Non-rebreather mask

## 2013-12-18 NOTE — Brief Op Note (Signed)
12/18/2013  11:58 AM  PATIENT:  Alyssa Arellano  38 y.o. female  PRE-OPERATIVE DIAGNOSIS:  Ganglion cyst right foot  POST-OPERATIVE DIAGNOSIS:  Ganglion cyst right foot  Operative findings 1.5 x 2 cm firm mass without cystic character perhaps differential diagnosis will include a neurofibroma   PROCEDURE:  Procedure(s): REMOVAL GANGLION CYST RIGHT FOOT (Right)  SURGEON:  Surgeon(s) and Role:    * Kael Keetch E Lylianna Fraiser, MD - Primary  PHYSICIAN ASSISTANT:   ASSISTANTS: none   ANESTHESIA:   local and MAC  EBL:  Total I/O In: 100 [I.V.:100] Out: 0   BLOOD ADMINISTERED:none  DRAINS: none   LOCAL MEDICATIONS USED:  LIDOCAINE  and Amount: 14 ml  SPECIMEN:  Source of Specimen:  In the subcutaneous tissue on the dorsum of the right foot in the tarsometatarsal region  DISPOSITION OF SPECIMEN:  PATHOLOGY  COUNTS:  YES  TOURNIQUET:   Total Tourniquet Time Documented: Thigh (Right) - 13 minutes Total: Thigh (Right) - 13 minutes   DICTATION: .Dragon Dictation  PLAN OF CARE: Discharge to home after PACU  PATIENT DISPOSITION:  PACU - hemodynamically stable.   Delay start of Pharmacological VTE agent (>24hrs) due to surgical blood loss or risk of bleeding: not applicable  Surgical technique Alyssa Arellano was identified in the preop area she confirmed right foot as a surgical site we marked the mass to be removed. The chart was reviewed and updated. The patient was taken to the operating room for MAC with local. After sterile prep and drape and timeout we injected the subcutaneous tissues with 1% lidocaine plain. We elevated the tourniquet. We made a transverse incision in Langer's lines and then supplemented an additional small amount of lidocaine 1% and then removed the mass which was entirely in the subcutaneous pain is tissues. The mass was encapsulated. We also continued our dissection just to ensure there were no other lesions  We irrigated the wound and closed with vertical  mattress sutures  A sterile dressing and a bandage was applied  The patient will be taken recovery room in stable condition  

## 2013-12-18 NOTE — Anesthesia Postprocedure Evaluation (Signed)
Anesthesia Post Note  Patient: Alyssa Arellano  Procedure(s) Performed: Procedure(s) (LRB): REMOVAL GANGLION CYST RIGHT FOOT (Right)  Anesthesia type: MAC  Patient location: PACU  Post pain: Pain level controlled  Post assessment: Post-op Vital signs reviewed, Patient's Cardiovascular Status Stable, Respiratory Function Stable, Patent Airway, No signs of Nausea or vomiting and Pain level controlled  Last Vitals:  Filed Vitals:   12/18/13 1205  BP: 124/76  Pulse: 77  Temp: 36.5 C  Resp: 26    Post vital signs: Reviewed and stable  Level of consciousness: awake and alert   Complications: No apparent anesthesia complications

## 2013-12-18 NOTE — Addendum Note (Signed)
Addendum created 12/18/13 1208 by Franco Noneseresa S Syrianna Schillaci, CRNA   Modules edited: Anesthesia Medication Administration

## 2013-12-18 NOTE — Op Note (Signed)
12/18/2013  11:58 AM  PATIENT:  Alyssa RankinsSamantha G Arellano  38 y.o. female  PRE-OPERATIVE DIAGNOSIS:  Ganglion cyst right foot  POST-OPERATIVE DIAGNOSIS:  Ganglion cyst right foot  Operative findings 1.5 x 2 cm firm mass without cystic character perhaps differential diagnosis will include a neurofibroma   PROCEDURE:  Procedure(s): REMOVAL GANGLION CYST RIGHT FOOT (Right)  SURGEON:  Surgeon(s) and Role:    * Vickki HearingStanley E Jordell Outten, MD - Primary  PHYSICIAN ASSISTANT:   ASSISTANTS: none   ANESTHESIA:   local and MAC  EBL:  Total I/O In: 100 [I.V.:100] Out: 0   BLOOD ADMINISTERED:none  DRAINS: none   LOCAL MEDICATIONS USED:  LIDOCAINE  and Amount: 14 ml  SPECIMEN:  Source of Specimen:  In the subcutaneous tissue on the dorsum of the right foot in the tarsometatarsal region  DISPOSITION OF SPECIMEN:  PATHOLOGY  COUNTS:  YES  TOURNIQUET:   Total Tourniquet Time Documented: Thigh (Right) - 13 minutes Total: Thigh (Right) - 13 minutes   DICTATION: .Reubin Milanragon Dictation  PLAN OF CARE: Discharge to home after PACU  PATIENT DISPOSITION:  PACU - hemodynamically stable.   Delay start of Pharmacological VTE agent (>24hrs) due to surgical blood loss or risk of bleeding: not applicable  Surgical technique Ms. Webb SilversmithWelch was identified in the preop area she confirmed right foot as a surgical site we marked the mass to be removed. The chart was reviewed and updated. The patient was taken to the operating room for MAC with local. After sterile prep and drape and timeout we injected the subcutaneous tissues with 1% lidocaine plain. We elevated the tourniquet. We made a transverse incision in Langer's lines and then supplemented an additional small amount of lidocaine 1% and then removed the mass which was entirely in the subcutaneous pain is tissues. The mass was encapsulated. We also continued our dissection just to ensure there were no other lesions  We irrigated the wound and closed with vertical  mattress sutures  A sterile dressing and a bandage was applied  The patient will be taken recovery room in stable condition

## 2013-12-18 NOTE — Transfer of Care (Signed)
Immediate Anesthesia Transfer of Care Note  Patient: Alyssa Arellano  Procedure(s) Performed: Procedure(s) (LRB): REMOVAL GANGLION CYST RIGHT FOOT (Right)  Patient Location: PACU  Anesthesia Type: MAC  Level of Consciousness: awake  Airway & Oxygen Therapy: Patient Spontanous Breathing. Nasal cannula  Post-op Assessment: Report given to PACU RN, Post -op Vital signs reviewed and stable and Patient moving all extremities  Post vital signs: Reviewed and stable  Complications: No apparent anesthesia complications

## 2013-12-21 ENCOUNTER — Encounter (HOSPITAL_COMMUNITY): Payer: Self-pay | Admitting: Orthopedic Surgery

## 2013-12-21 ENCOUNTER — Ambulatory Visit (INDEPENDENT_AMBULATORY_CARE_PROVIDER_SITE_OTHER): Payer: Medicaid Other | Admitting: Orthopedic Surgery

## 2013-12-21 VITALS — BP 136/93 | Ht 65.0 in | Wt 204.0 lb

## 2013-12-21 DIAGNOSIS — Z9889 Other specified postprocedural states: Secondary | ICD-10-CM

## 2013-12-21 DIAGNOSIS — M6740 Ganglion, unspecified site: Secondary | ICD-10-CM

## 2013-12-21 NOTE — Patient Instructions (Signed)
Weight Bearing in shoe and with crutches

## 2013-12-21 NOTE — Progress Notes (Signed)
Patient ID: Alyssa RankinsSamantha G Coone, female   DOB: 08/07/1976, 38 y.o.   MRN: [id number] Chief Complaint  Patient presents with  . Follow-up    Post op #1, removal of ganglion on right foot. DOS 12-18-13.    Status post removal of ganglion from the right foot Mrs. postop day #2 she is doing well she can start weightbearing with crutches and a postop shoe return postop day 13 for suture removal.

## 2013-12-31 ENCOUNTER — Encounter: Payer: Self-pay | Admitting: Orthopedic Surgery

## 2013-12-31 ENCOUNTER — Ambulatory Visit (INDEPENDENT_AMBULATORY_CARE_PROVIDER_SITE_OTHER): Payer: Medicaid Other | Admitting: Orthopedic Surgery

## 2013-12-31 VITALS — BP 135/97 | Ht 65.0 in | Wt 204.0 lb

## 2013-12-31 DIAGNOSIS — M722 Plantar fascial fibromatosis: Secondary | ICD-10-CM | POA: Insufficient documentation

## 2013-12-31 NOTE — Progress Notes (Signed)
Patient ID: Geryl RankinsSamantha G Arellano, female   DOB: 12/21/1975, 38 y.o.   MRN: [id number] Chief Complaint  Patient presents with  . 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

## 2013-12-31 NOTE — Patient Instructions (Signed)
Fibroma

## 2014-03-03 ENCOUNTER — Telehealth: Payer: Self-pay | Admitting: Family Medicine

## 2014-03-03 NOTE — Telephone Encounter (Signed)
Offered patient appt for tomorrow but patient declines. Advised that if she couldn't wait she should go to Urgent Care but patient did not want to do that either. Asked if office was open on Sat and advised patient it was. Advised patient if she was here at 8am on sat as a walk she could be seen. Patient unsure if she could do that.  Requested to make an appt for Sat but advised patient she should call back Friday and check to see what was available.

## 2014-10-23 ENCOUNTER — Encounter: Payer: Self-pay | Admitting: Family Medicine

## 2014-10-23 ENCOUNTER — Ambulatory Visit (INDEPENDENT_AMBULATORY_CARE_PROVIDER_SITE_OTHER): Payer: Medicaid Other | Admitting: Family Medicine

## 2014-10-23 VITALS — BP 140/91 | HR 68 | Temp 97.1°F | Ht 65.0 in | Wt 210.0 lb

## 2014-10-23 DIAGNOSIS — G44209 Tension-type headache, unspecified, not intractable: Secondary | ICD-10-CM

## 2014-10-23 DIAGNOSIS — E669 Obesity, unspecified: Secondary | ICD-10-CM

## 2014-10-23 DIAGNOSIS — R030 Elevated blood-pressure reading, without diagnosis of hypertension: Secondary | ICD-10-CM

## 2014-10-23 LAB — POCT CBC
Granulocyte percent: 64.7 %G (ref 37–80)
HEMATOCRIT: 45.5 % (ref 37.7–47.9)
Hemoglobin: 14.3 g/dL (ref 12.2–16.2)
Lymph, poc: 2.7 (ref 0.6–3.4)
MCH, POC: 26.8 pg — AB (ref 27–31.2)
MCHC: 31.5 g/dL — AB (ref 31.8–35.4)
MCV: 84.9 fL (ref 80–97)
MPV: 9.5 fL (ref 0–99.8)
POC GRANULOCYTE: 5.6 (ref 2–6.9)
POC LYMPH PERCENT: 31.5 %L (ref 10–50)
Platelet Count, POC: 234 10*3/uL (ref 142–424)
RBC: 5.4 M/uL (ref 4.04–5.48)
RDW, POC: 14.2 %
WBC: 8.6 10*3/uL (ref 4.6–10.2)

## 2014-10-23 NOTE — Progress Notes (Signed)
   Subjective:    Patient ID: Alyssa Arellano, female    DOB: 30-Mar-1976, 38 y.o.   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 She does a lot of housework. Headaches have been off and on. She has been checking her blood pressure at home and they are the blood pressures run anywhere from 134-144/96-98. It is an arm cuff. She does drink a lot of carbonated beverages.repeat blood pressure laying down left arm large cuff 140/96          Review of Systems  Constitutional: Negative.   Eyes: Negative.   Respiratory: Negative.   Cardiovascular: Negative.   Gastrointestinal: Negative.   Endocrine: Negative.   Genitourinary: Negative.   Musculoskeletal: Negative.   Allergic/Immunologic: Negative.   Neurological: Positive for headaches (2-3 x per week). Negative for dizziness.  Hematological: Negative.   Psychiatric/Behavioral: Negative.        Objective:   Physical Exam  Constitutional: She is oriented to person, place, and time. She appears well-developed and well-nourished. No distress.  Pleasant cooperative and admits to eating too much salt  HENT:  Right Ear: External ear normal.  Left Ear: External ear normal.  Mouth/Throat: Oropharynx is clear and moist.  Eyes: Conjunctivae and EOM are normal. Pupils are equal, round, and reactive to light. Right eye exhibits no discharge. Left eye exhibits no discharge. No scleral icterus.  Neck: Normal range of motion. Neck supple. No thyromegaly present.  Cardiovascular: Normal rate, regular rhythm and normal heart sounds.   No murmur heard. Pulmonary/Chest: Effort normal and breath sounds normal. No respiratory distress. She has no wheezes. She has no rales. She exhibits no tenderness.  Abdominal: She exhibits no mass.  Musculoskeletal: Normal range of motion.  Lymphadenopathy:    She has no cervical adenopathy.  Neurological: She is alert  and oriented to person, place, and time.  Skin: Skin is warm and dry. No rash noted.  Psychiatric: She has a normal mood and affect. Her behavior is normal. Judgment and thought content normal.  Nursing note and vitals reviewed.  BP 135/83 mmHg  Pulse 64  Temp(Src) 97.1 F (36.2 C) (Oral)  Ht $R'5\' 5"'xG$  (1.651 m)  Wt 210 lb (95.255 kg)  BMI 34.95 kg/m2  LMP 10/03/2014        Assessment & Plan:   1. Increased blood pressure (not hypertension) - BMP8+EGFR - Thyroid Panel With TSH  2. Tension-type headache, not intractable, unspecified chronicity pattern - POCT CBC  3. Obesity - BMP8+EGFR - Thyroid Panel With TSH  Patient Instructions  Watch sodium intake Drink more water and less carbonated beverages 1 carbonated beverage per day is 15 pounds yearly Watch carbohydrate intake Monitor blood pressures as directed and bring these in for review to your next visit along with you're on blood pressure monitor to check your blood pressure here   Arrie Senate MD

## 2014-10-23 NOTE — Patient Instructions (Signed)
Watch sodium intake Drink more water and less carbonated beverages 1 carbonated beverage per day is 15 pounds yearly Watch carbohydrate intake Monitor blood pressures as directed and bring these in for review to your next visit along with you're on blood pressure monitor to check your blood pressure here

## 2014-10-26 LAB — BMP8+EGFR
BUN / CREAT RATIO: 11 (ref 8–20)
BUN: 10 mg/dL (ref 6–20)
CHLORIDE: 99 mmol/L (ref 97–108)
CO2: 24 mmol/L (ref 18–29)
Calcium: 9.3 mg/dL (ref 8.7–10.2)
Creatinine, Ser: 0.9 mg/dL (ref 0.57–1.00)
GFR, EST AFRICAN AMERICAN: 95 mL/min/{1.73_m2} (ref >59)
GFR, EST NON AFRICAN AMERICAN: 83 mL/min/{1.73_m2} (ref >59)
Glucose: 96 mg/dL (ref 65–99)
POTASSIUM: 4.3 mmol/L (ref 3.5–5.2)
SODIUM: 140 mmol/L (ref 134–144)

## 2014-11-09 ENCOUNTER — Ambulatory Visit (INDEPENDENT_AMBULATORY_CARE_PROVIDER_SITE_OTHER): Payer: Medicaid Other | Admitting: Family Medicine

## 2014-11-09 ENCOUNTER — Encounter: Payer: Self-pay | Admitting: Family Medicine

## 2014-11-09 VITALS — BP 128/76 | HR 71 | Temp 98.1°F | Ht 65.0 in | Wt 211.0 lb

## 2014-11-09 DIAGNOSIS — G44209 Tension-type headache, unspecified, not intractable: Secondary | ICD-10-CM

## 2014-11-09 DIAGNOSIS — M542 Cervicalgia: Secondary | ICD-10-CM

## 2014-11-09 MED ORDER — CYCLOBENZAPRINE HCL 10 MG PO TABS: 10.0000 mg | ORAL_TABLET | Freq: Three times a day (TID) | ORAL | Status: DC | PRN

## 2014-11-09 MED ORDER — NAPROXEN 500 MG PO TABS: 500.0000 mg | ORAL_TABLET | Freq: Two times a day (BID) | ORAL | Status: DC

## 2014-11-09 NOTE — Progress Notes (Signed)
   Subjective:    Patient ID: Geryl RankinsSamantha G Eisenhardt, female    DOB: 02/17/1976, 38 y.o.   MRN: [id number]  HPI Patient is here for follow up on her blood pressure.  She brings with her a blood pressure log which shows normal blood pressure readings on average of 130/80.  She has been having headaches 3-4 a week.  She is not having headache at present.  She states she gets cervical spine discomfort and then it travels around her head.  Review of Systems  Constitutional: Negative for fever.  HENT: Negative for ear pain.   Eyes: Negative for discharge.  Respiratory: Negative for cough.   Cardiovascular: Negative for chest pain.  Gastrointestinal: Negative for abdominal distention.  Endocrine: Negative for polyuria.  Genitourinary: Negative for difficulty urinating.  Musculoskeletal: Negative for gait problem and neck pain.  Skin: Negative for color change and rash.  Neurological: Negative for speech difficulty and headaches.  Psychiatric/Behavioral: Negative for agitation.       Objective:    BP 128/76 mmHg  Pulse 71  Temp(Src) 98.1 F (36.7 C) (Oral)  Ht 5\' 5"  (1.651 m)  Wt 211 lb (95.709 kg)  BMI 35.11 kg/m2  LMP 11/09/2014 Physical Exam  Constitutional: She is oriented to person, place, and time. She appears well-developed and well-nourished.  HENT:  Head: Normocephalic and atraumatic.  Mouth/Throat: Oropharynx is clear and moist.  Eyes: Pupils are equal, round, and reactive to light.  Neck: Normal range of motion. Neck supple.  Cardiovascular: Normal rate and regular rhythm.   No murmur heard. Pulmonary/Chest: Effort normal and breath sounds normal.  Abdominal: Soft. Bowel sounds are normal. There is no tenderness.  Neurological: She is alert and oriented to person, place, and time.  Skin: Skin is warm and dry.  Psychiatric: She has a normal mood and affect.          Assessment & Plan:     ICD-9-CM ICD-10-CM   1. Cervicalgia 723.1 M54.2 naproxen (NAPROSYN) 500 MG  tablet     cyclobenzaprine (FLEXERIL) 10 MG tablet  2. Tension-type headache, not intractable, unspecified chronicity pattern 339.10 G44.209 naproxen (NAPROSYN) 500 MG tablet     cyclobenzaprine (FLEXERIL) 10 MG tablet   Explained to patient that she is normotensive and should get control of her cephalgia which is causing hypertension.  Recommend PT if not better with HA's.  No Follow-up on file.  Deatra CanterWilliam J Oxford FNP

## 2014-11-21 ENCOUNTER — Other Ambulatory Visit: Payer: Self-pay | Admitting: Family Medicine

## 2014-11-22 NOTE — Telephone Encounter (Signed)
Last refill 11/09/14. Last ov 11/09/14.

## 2015-05-30 ENCOUNTER — Telehealth: Payer: Self-pay | Admitting: Internal Medicine

## 2015-05-30 ENCOUNTER — Encounter: Payer: Self-pay | Admitting: Internal Medicine

## 2015-05-30 NOTE — Telephone Encounter (Signed)
Patient stopped taking Omeprazole. She is now having gas and loose bowel movements. She take Naproxen regularly for headaches. She will resume Omeprazole. Avoid spicy and foods she knows to upset her stomach. Try to avoid taking Naproxen as much as possible. Take Naproxen only with a meal if she has to take it. OV if she acutely worsens or fails to improve.

## 2015-06-01 ENCOUNTER — Telehealth: Payer: Self-pay

## 2015-06-01 NOTE — Telephone Encounter (Signed)
Called patient to get info on where she needed referral to and she states that she spoke to someone here at the office earlier and it has been taken care of.

## 2015-06-01 NOTE — Telephone Encounter (Signed)
i need a referral   Last seen 12/15  B Oxford

## 2015-06-03 NOTE — Telephone Encounter (Signed)
Lm on vm 

## 2015-06-10 NOTE — Telephone Encounter (Signed)
Spoke with patient who stated her Omeprazole was helping some but not completely alleviating her symptoms.  I told her to stick to the suggestions [facility] - avoid spicy foods, cut down on Naproxen or take with food.  Patient wanted to know if there was a different medication to try so I encouraged her to contact her insurance company to see what other PPIs were on her formulary.  At that point I could send an alternative.  Patient agreed.

## 2015-08-10 ENCOUNTER — Encounter: Payer: Self-pay | Admitting: Internal Medicine

## 2015-08-10 ENCOUNTER — Ambulatory Visit (INDEPENDENT_AMBULATORY_CARE_PROVIDER_SITE_OTHER): Payer: Medicaid Other | Admitting: Internal Medicine

## 2015-08-10 VITALS — BP 112/70 | HR 66 | Ht 63.0 in | Wt 214.0 lb

## 2015-08-10 DIAGNOSIS — K219 Gastro-esophageal reflux disease without esophagitis: Secondary | ICD-10-CM | POA: Diagnosis not present

## 2015-08-10 DIAGNOSIS — F411 Generalized anxiety disorder: Secondary | ICD-10-CM

## 2015-08-10 DIAGNOSIS — K589 Irritable bowel syndrome without diarrhea: Secondary | ICD-10-CM | POA: Diagnosis not present

## 2015-08-10 MED ORDER — OMEPRAZOLE 20 MG PO CPDR: 20.0000 mg | DELAYED_RELEASE_CAPSULE | Freq: Every day | ORAL | Status: DC

## 2015-08-10 MED ORDER — CILIDINIUM-CHLORDIAZEPOXIDE 2.5-5 MG PO CAPS: ORAL_CAPSULE | ORAL | Status: DC

## 2015-08-10 NOTE — Patient Instructions (Signed)
We have sent the following medications to your pharmacy for you to pick up at your convenience:  Omeprazole and Librax  Please follow up in one year

## 2015-08-10 NOTE — Progress Notes (Signed)
HISTORY OF PRESENT ILLNESS:  Alyssa Arellano is a 39 y.o. female with a history of morbid obesity, anxiety, GERD, and diarrhea predominant IBS. Patient presents today with a chief complaint of worsening GERD symptoms as well as worsening IBS. She has not been seen in this office since 06/03/2007. At that time she was on lansoprazole for GERD with good control of symptoms. Her IBS was stable on Librax. Current problems have worsened over the past year. She has significant regurgitation and pyrosis for which she takes Rolaids. She also takes NSAIDs. No dysphagia. She has gained 15-20 pounds since her last visit. Next, she reports intermittent postprandial urgency with loose stools. Symptoms also occur in times of anxiety. There has been no bleeding, fever, or weight loss. She is on no prescription medications.  REVIEW OF SYSTEMS:  All non-GI ROS negative except for sinus allergy troubles, headaches, menstrual pain, night sweats, sleeping problems, anxiety  Past Medical History  Diagnosis Date  . GERD (gastroesophageal reflux disease)   . IBS (irritable bowel syndrome)   . PONV (postoperative nausea and vomiting)   . Skin disorder     possibly psoriosis, but pt is unsure. Causes red, spotchy, dry spots on body when it gets cold.  . Anxiety     Past Surgical History  Procedure Laterality Date  . Cesarean section  2006    [facility]-Dr. Ralph Dowdy  . Ganglion cyst excision Right 12/18/2013    Procedure: REMOVAL GANGLION CYST RIGHT FOOT;  Surgeon: Vickki Hearing, MD;  Location: [facility];  Service: Orthopedics;  Laterality: Right;    Social History Alyssa Arellano  reports that she has never smoked. She does not have any smokeless tobacco history on file. She reports that she does not drink alcohol or use illicit drugs.  family history is not on file.  Allergies  Allergen Reactions  . Penicillins        PHYSICAL EXAMINATION: Vital signs: BP 112/70 mmHg  Pulse 66  Ht  (1.6 m)   Wt 214 lb (97.07 kg)  BMI 37.92 kg/m2  Constitutional: Pleasant, obese, generally well-appearing, no acute distress Psychiatric: alert and oriented x3, cooperative Eyes: extraocular movements intact, anicteric, conjunctiva pink Mouth: oral pharynx moist, no lesions Neck: supple , thyroid normal, no lymphadenopathy Cardiovascular: heart regular rate and rhythm, no murmur Lungs: clear to auscultation bilaterally Abdomen: soft, obese, nontender, nondistended, no obvious ascites, no peritoneal signs, normal bowel sounds, no organomegaly Rectal: Omitted Extremities: no clubbing cyanosis or lower extremity edema bilaterally Skin: no lesions on visible extremities Neuro: No focal deficits. Cranial nerves intact.   ASSESSMENT:  #1. GERD. No alarm features but with significant active symptoms. Currently treated with antiacids only #2. Diarrhea predominant IBS. Deteriorated. Triggers are food and anxiety #3. Anxiety #4. Morbid obesity   PLAN:  #1. Reflux precautions #2. Sustained weight loss. #3. Prescribe omeprazole 20 mg daily. 11 refills #4. Prescribed Librax one before meals as needed. Multiple refills #5. Talk to PCP about anxiety issues #6. Routine GI follow-up in 1 year. Sooner if needed

## 2015-08-11 ENCOUNTER — Telehealth: Payer: Self-pay | Admitting: *Deleted

## 2015-08-11 NOTE — Telephone Encounter (Signed)
-----   Message from Ernestina Penna, MD sent at 08/10/2015  9:54 PM EDT ----- Appointment to discuss anxiety issues with provider ----- Message -----    From: Hilarie Fredrickson, MD    Sent: 08/10/2015   5:34 PM      To: Ernestina Penna, MD

## 2015-08-16 ENCOUNTER — Ambulatory Visit: Payer: Medicaid Other | Admitting: Family Medicine

## 2015-08-30 ENCOUNTER — Ambulatory Visit (INDEPENDENT_AMBULATORY_CARE_PROVIDER_SITE_OTHER): Payer: Medicaid Other | Admitting: Family Medicine

## 2015-08-30 ENCOUNTER — Encounter: Payer: Self-pay | Admitting: Family Medicine

## 2015-08-30 VITALS — BP 138/88 | HR 72 | Temp 97.5°F | Ht 63.0 in | Wt 220.0 lb

## 2015-08-30 DIAGNOSIS — K589 Irritable bowel syndrome without diarrhea: Secondary | ICD-10-CM | POA: Diagnosis not present

## 2015-08-30 DIAGNOSIS — R040 Epistaxis: Secondary | ICD-10-CM | POA: Diagnosis not present

## 2015-08-30 DIAGNOSIS — F411 Generalized anxiety disorder: Secondary | ICD-10-CM

## 2015-08-30 MED ORDER — DULOXETINE HCL 30 MG PO CPEP: 30.0000 mg | ORAL_CAPSULE | Freq: Every day | ORAL | Status: DC

## 2015-08-30 MED ORDER — FLUTICASONE PROPIONATE 50 MCG/ACT NA SUSP: 2.0000 | Freq: Every day | NASAL | Status: DC

## 2015-08-30 NOTE — Progress Notes (Signed)
Subjective:  Patient ID: Alyssa Arellano, female    DOB: 06-22-1976  Age: 39 y.o. MRN: [id number]  CC: GAD   HPI [name] presents for feeling overwhelmed and anxious due to situation at home. She has a 33 year old son and a 45 year old son. They keep her busy but that is not an issue except since her 37 year old has become defiant and refuses to cooperate with household chores and directives given by mom. The patient says her husband tries to work with him but the son will not complete chores weed-eat so he doesn't do that. He doesn't watch his close or clean his room etc. in spite of mom's directives. Due to this she has become overwhelmed and feels anxious and nervous as well as dysphoric and at times feels panicky. She also has irritable bowel syndrome and this is exacerbated those symptoms. She has to make sure she is near a bathroom regardless of what she is doing or where she goes.  History Vidalia has a past medical history of GERD (gastroesophageal reflux disease); IBS (irritable bowel syndrome); PONV (postoperative nausea and vomiting); Skin disorder; and Anxiety.   She has past surgical history that includes Cesarean section (2006) and Ganglion cyst excision (Right, 12/18/2013).   Her family history is not on file.She reports that she has never smoked. She does not have any smokeless tobacco history on file. She reports that she does not drink alcohol or use illicit drugs.  Outpatient Prescriptions Prior to Visit  Medication Sig Dispense Refill  . acetaminophen (TYLENOL) 500 MG tablet Take 1,000 mg by mouth every 6 (six) hours as needed for moderate pain.    . Ca Carbonate-Mag Hydroxide (ROLAIDS PO) Take by mouth as needed.    . calcium carbonate (TUMS EX) 750 MG chewable tablet Chew 1 tablet by mouth as needed for heartburn.    . clidinium-chlordiazePOXIDE (LIBRAX) 5-2.5 MG per capsule Take one tablet by mouth before meals as needed 100 capsule 3  . naproxen (NAPROSYN) 500  MG tablet TAKE 1 TABLET (500 MG TOTAL) BY MOUTH 2 (TWO) TIMES DAILY WITH A MEAL. 30 tablet 0  . omeprazole (PRILOSEC) 20 MG capsule Take 1 capsule (20 mg total) by mouth daily. 30 capsule 11   No facility-administered medications prior to visit.    ROS Review of Systems  Constitutional: Negative for fever, chills, diaphoresis, appetite change, fatigue and unexpected weight change.  HENT: Positive for postnasal drip and rhinorrhea (occasional mucousy bloody discharge in the morning briefly.). Negative for congestion, ear pain, hearing loss, sneezing, sore throat and trouble swallowing.   Eyes: Negative for pain.  Respiratory: Negative for cough, chest tightness and shortness of breath.   Cardiovascular: Negative for chest pain and palpitations.  Gastrointestinal: Positive for abdominal pain and diarrhea. Negative for nausea, vomiting and constipation.  Genitourinary: Negative for dysuria, frequency and menstrual problem.  Musculoskeletal: Negative for joint swelling and arthralgias.  Skin: Negative for rash.  Neurological: Negative for dizziness, weakness, numbness and headaches.  Psychiatric/Behavioral: Negative for dysphoric mood and agitation.    Objective:  BP 138/88 mmHg  Pulse 72  Temp(Src) 97.5 F (36.4 C) (Oral)  Ht  (1.6 m)  Wt 220 lb (99.791 kg)  BMI 38.98 kg/m2  LMP 08/01/2015  BP Readings from Last 3 Encounters:  08/30/15 138/88  08/10/15 112/70  11/09/14 128/76    Wt Readings from Last 3 Encounters:  08/30/15 220 lb (99.791 kg)  08/10/15 214 lb (97.07 kg)  11/09/14 211 lb (95.709  kg)     Physical Exam  Constitutional: She is oriented to person, place, and time. She appears well-developed and well-nourished. No distress.  HENT:  Head: Normocephalic and atraumatic.  Right Ear: External ear normal.  Left Ear: External ear normal.  Nose: Mucosal edema present. Right sinus exhibits no maxillary sinus tenderness and no frontal sinus tenderness. Left sinus  exhibits no maxillary sinus tenderness and no frontal sinus tenderness.  Mouth/Throat: Oropharynx is clear and moist.  Eyes: Conjunctivae and EOM are normal. Pupils are equal, round, and reactive to light.  Neck: Normal range of motion. Neck supple. No thyromegaly present.  Cardiovascular: Normal rate, regular rhythm and normal heart sounds.   No murmur heard. Pulmonary/Chest: Effort normal and breath sounds normal. No respiratory distress. She has no wheezes. She has no rales.  Abdominal: Soft. Bowel sounds are normal. She exhibits no distension. There is no tenderness.  Lymphadenopathy:    She has no cervical adenopathy.  Neurological: She is alert and oriented to person, place, and time. She has normal reflexes.  Skin: Skin is warm and dry.  Psychiatric: Her behavior is normal. Thought content normal.    No results found for: HGBA1C  Lab Results  Component Value Date   WBC 8.6 10/23/2014   HGB 14.3 10/23/2014   HCT 45.5 10/23/2014   GLUCOSE 96 10/23/2014   NA 140 10/23/2014   K 4.3 10/23/2014   CL 99 10/23/2014   CREATININE 0.90 10/23/2014   BUN 10 10/23/2014   CO2 24 10/23/2014    No results found.  Assessment & Plan:   Karlye was seen today for gad.  Diagnoses and all orders for this visit:  Irritable bowel syndrome  Generalized anxiety disorder  Epistaxis  Other orders -     DULoxetine (CYMBALTA) 30 MG capsule; Take 1 capsule (30 mg total) by mouth daily. For one week then two daily. Take with a full stomach at suppertime -     fluticasone (FLONASE) 50 MCG/ACT nasal spray; Place 2 sprays into both nostrils daily.   I am having Ms. [place] start on DULoxetine and fluticasone. I am also having her maintain her acetaminophen, Ca Carbonate-Mag Hydroxide (ROLAIDS PO), calcium carbonate, naproxen, omeprazole, and clidinium-chlordiazePOXIDE.  Meds ordered this encounter  Medications  . DULoxetine (CYMBALTA) 30 MG capsule    Sig: Take 1 capsule (30 mg total) by  mouth daily. For one week then two daily. Take with a full stomach at suppertime    Dispense:  60 capsule    Refill:  0  . fluticasone (FLONASE) 50 MCG/ACT nasal spray    Sig: Place 2 sprays into both nostrils daily.    Dispense:  16 g    Refill:  6     Follow-up: Return in about 1 month (around 09/29/2015).  Mechele Claude, M.D.

## 2015-10-04 ENCOUNTER — Ambulatory Visit: Payer: Medicaid Other | Admitting: Family Medicine

## 2016-10-18 ENCOUNTER — Other Ambulatory Visit: Payer: Self-pay | Admitting: Internal Medicine

## 2016-12-10 ENCOUNTER — Other Ambulatory Visit: Payer: Self-pay | Admitting: Internal Medicine

## 2017-03-16 ENCOUNTER — Ambulatory Visit (INDEPENDENT_AMBULATORY_CARE_PROVIDER_SITE_OTHER): Payer: Medicaid Other | Admitting: Pediatrics

## 2017-03-16 ENCOUNTER — Encounter: Payer: Self-pay | Admitting: Pediatrics

## 2017-03-16 VITALS — BP 145/101 | HR 84 | Temp 97.5°F | Ht 63.0 in | Wt 214.2 lb

## 2017-03-16 DIAGNOSIS — M25511 Pain in right shoulder: Secondary | ICD-10-CM | POA: Diagnosis not present

## 2017-03-16 NOTE — Progress Notes (Addendum)
  Subjective:   Patient ID: Alyssa Arellano, female    DOB: 11/18/1976, 41 y.o.   MRN: [id number] CC: Shoulder Pain (x 3 weeks)  HPI: Alyssa Arellano is a 41 y.o. female presenting for Shoulder Pain (x 3 weeks)  Denies any shoulder injury Cooking and cleaning per usual at home, now limited by R shoulder pain Says it hurts "all the way to the bone" No bruising Hurts in the front of her shoulder and in back Any movement of arm away from body causes pain Has bene holding with elbow bent at her side No fevers No redness or tenderness or bruising that she has noticed with the shoulder No weakness Sometimes will have tingling in her R hand No neck pain No recent falls  No chance she could be pregnant, regular periods  Relevant past medical, surgical, family and social history reviewed. Allergies and medications reviewed and updated. History  Smoking Status  . Never Smoker  Smokeless Tobacco  . Never Used   ROS: Per HPI   Objective:    BP (!) 145/101   Pulse 84   Temp 97.5 F (36.4 C) (Oral)   Ht  (1.6 m)   Wt 214 lb 3.2 oz (97.2 kg)   BMI 37.94 kg/m   Wt Readings from Last 3 Encounters:  03/16/17 214 lb 3.2 oz (97.2 kg)  08/30/15 220 lb (99.8 kg)  08/10/15 214 lb (97.1 kg)    Gen: NAD, alert, cooperative with exam, NCAT EYES: EOMI, no conjunctival injection, or no icterus CV: NRRR, normal S1/S2, no murmur, distal pulses 2+ b/l Resp: CTABL, no wheezes, normal WOB Ext: No edema, warm Neuro: Alert and oriented, strength equal b/l hand grip. Sensation intact b/l UE.  MSK: normal muscle bulk Shoulders symmetric, normal to inspection Not able to abduct R arm more than 15 degrees to the side form her body without pain, can lift to apprx 60 degrees before limited by pain. Passive ROM full though with some pain in the back of shoulder when abducted. Pain with ext rotation passively, internal rot pain when against resistance.  No ttp along spine or clavicles  Assessment  & Plan:  Alyssa Arellano was seen today for shoulder pain.  Diagnoses and all orders for this visit:  Acute pain of right shoulder rtc Monday for xray Will refer to PT if xray is normal for shoulder strengthening and ROM -     DG Shoulder Right; Future  Elevated BP Recheck next visit  Follow up plan: Return in about 2 weeks (around 03/30/2017) for needs CPE. 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

## 2017-03-18 ENCOUNTER — Other Ambulatory Visit (INDEPENDENT_AMBULATORY_CARE_PROVIDER_SITE_OTHER): Payer: Medicaid Other

## 2017-03-18 DIAGNOSIS — M25511 Pain in right shoulder: Secondary | ICD-10-CM

## 2017-03-19 ENCOUNTER — Telehealth: Payer: Self-pay | Admitting: *Deleted

## 2017-03-19 NOTE — Addendum Note (Signed)
Addended by: Johna Sheriff on: 03/19/2017 07:40 PM   Modules accepted: Orders

## 2017-03-19 NOTE — Telephone Encounter (Signed)
Unknown caller complaining very angrily  that Alyssa Arellano should have been contacted yesterday about her shoulder x-ray results . I was trying to explain that the x-ray had to be sent to [facility] radiology for reading and if there had been an emergency result it would have been called back to office as soon as it was read.  He was very upset and did not want any explanations.  This  Person would not identify himself .  When ask if he was on hippa for this patient began cursing again and said he had lived with her for over 20 years and it did not make a difference!!

## 2017-03-26 ENCOUNTER — Ambulatory Visit: Payer: Medicaid Other | Attending: Pediatrics | Admitting: Physical Therapy

## 2017-03-26 DIAGNOSIS — M6281 Muscle weakness (generalized): Secondary | ICD-10-CM

## 2017-03-26 DIAGNOSIS — M25511 Pain in right shoulder: Secondary | ICD-10-CM | POA: Diagnosis not present

## 2017-03-26 NOTE — Therapy (Addendum)
[facility] [place], [place], [address] Phone: [phone]   Fax:  [phone]  Physical Therapy Evaluation  Patient Details  Name: Alyssa Arellano MRN: [id number] Date of Birth: 1976/05/13 Referring Provider: Assunta Found MD.  Encounter Date: 03/26/2017      PT End of Session - 03/26/17 1039    Visit Number 1   Number of Visits 1   Date for PT Re-Evaluation 03/26/17   PT Start Time 0852   PT Stop Time 0919   PT Time Calculation (min) 27 min   Activity Tolerance Patient tolerated treatment well   Behavior During Therapy [facility] for tasks assessed/performed      Past Medical History:  Diagnosis Date  . Anxiety   . GERD (gastroesophageal reflux disease)   . IBS (irritable bowel syndrome)   . PONV (postoperative nausea and vomiting)   . Skin disorder    possibly psoriosis, but pt is unsure. Causes red, spotchy, dry spots on body when it gets cold.    Past Surgical History:  Procedure Laterality Date  . CESAREAN SECTION  2006   [facility]-Dr. Evie Lacks  . GANGLION CYST EXCISION Right 12/18/2013   Procedure: REMOVAL GANGLION CYST RIGHT FOOT;  Surgeon: Carole Civil, MD;  Location: [facility];  Service: Orthopedics;  Laterality: Right;    There were no vitals filed for this visit.       Subjective Assessment - 03/26/17 0939    Subjective The patient states that her left shoulder began to hurt her about 2 weeks ago.  She is not sure why her shoulder started hurting but feels it may be related to pulling her recycling can back and forth to/from the road.  Her shoulder was hurting badly earlier this month so she went to her doctor on 03/16/17.  She states she has been moving her shoulder so she doesn't lose motion.  Certain movements increase her pain and rest decreases her pain.   Diagnostic tests X-ray shows calcific tendonits.   Currently in Pain? Yes   Pain Score 2    Pain Location Shoulder   Pain Orientation Left   Pain Descriptors / Indicators Aching;Throbbing   Pain Type Acute pain   Pain Onset 1 to 4 weeks ago   Pain Frequency Constant   Aggravating Factors  See above.   Pain Relieving Factors See above.            [facility] PT Assessment - 03/26/17 0001      Assessment   Medical Diagnosis Acute pain of right shoulder.   Referring Provider Assunta Found MD.   Onset Date/Surgical Date --  03/15/17 (patient report).     Precautions   Precautions None     Restrictions   Weight Bearing Restrictions No     Balance Screen   Has the patient fallen in the past 6 months No   Has the patient had a decrease in activity level because of a fear of falling?  No   Is the patient reluctant to leave their home because of a fear of falling?  No     Home Ecologist residence     Prior Function   Level of Independence Independent     Posture/Postural Control   Posture/Postural Control Postural limitations     ROM / Strength   AROM / PROM / Strength AROM;Strength     AROM   Overall AROM Comments Full AROM of right shouldder.  Strength   Overall Strength Comments Right shoulder IR/ER= 4 to 4+/5.     Palpation   Palpation comment Tender to palpation over right anterior shoulder and acromial ridge region with some c/o referred pain into middle deltoid region.     Special Tests    Special Tests Rotator Cuff Impingement  Normal UE DTR's.   Rotator Cuff Impingment tests Neer impingement test;Hawkins- Kennedy test;Speed's test     Neer Impingement test    Findings Negative   Side Right     Hawkins-Kennedy test   Findings Negative   Side Right     Speed's test   Findings Negative   Side Right     Ambulation/Gait   Gait Comments WNL.                           PT Education - 03/26/17 1019    Education provided Yes   Person(s) Educated Patient   Methods Explanation;Demonstration;Verbal cues   Comprehension Verbalized  understanding;Returned demonstration             PT Long Term Goals - 03/26/17 1109      PT LONG TERM GOAL #1   Title Eval only.             Patient will benefit from skilled therapeutic intervention in order to improve the following deficits and impairments:     Visit Diagnosis: Acute pain of right shoulder - Plan: PT plan of care cert/re-cert  Muscle weakness (generalized) - Plan: PT plan of care cert/re-cert     Problem List Patient Active Problem List   Diagnosis Date Noted  . 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 Ganglion cyst 12/21/2013  . GASTROESOPHAGEAL REFLUX DISEASE 04/02/2008  . IRRITABLE BOWEL SYNDROME 04/02/2008    Lorece Keach, [place] MPT 03/26/2017, 11:11 AM  [facility] [place], [place], [address] Phone: [phone]   Fax:  [phone]  Name: Alyssa Arellano MRN: [id number] Date of Birth: 1976-02-02  PHYSICAL THERAPY DISCHARGE SUMMARY  Visits from Start of Care: 1.  Current functional level related to goals / functional outcomes: See above.   Remaining deficits: Eval only.   Education / Equipment:  Plan: Patient agrees to discharge.  Patient goals were met. Patient is being discharged due to meeting the stated rehab goals.  ?????         [place] Mylisa Brunson MPT

## 2017-03-26 NOTE — Patient Instructions (Signed)
Instructed patient in wall climbs, door stretch for ER and behind back.  Also gave patient info on a TENS unit and how to perform ice massage.

## 2017-08-31 ENCOUNTER — Other Ambulatory Visit: Payer: Self-pay | Admitting: Internal Medicine

## 2018-02-28 IMAGING — DX DG SHOULDER 2+V*R*
3 series · 3 of 3 positions shown · non-contrast
Comparison: None.

CLINICAL DATA: Right shoulder pain for 2 weeks, no known injury

EXAM:
RIGHT SHOULDER - 2+ VIEW

[shoulder ap]
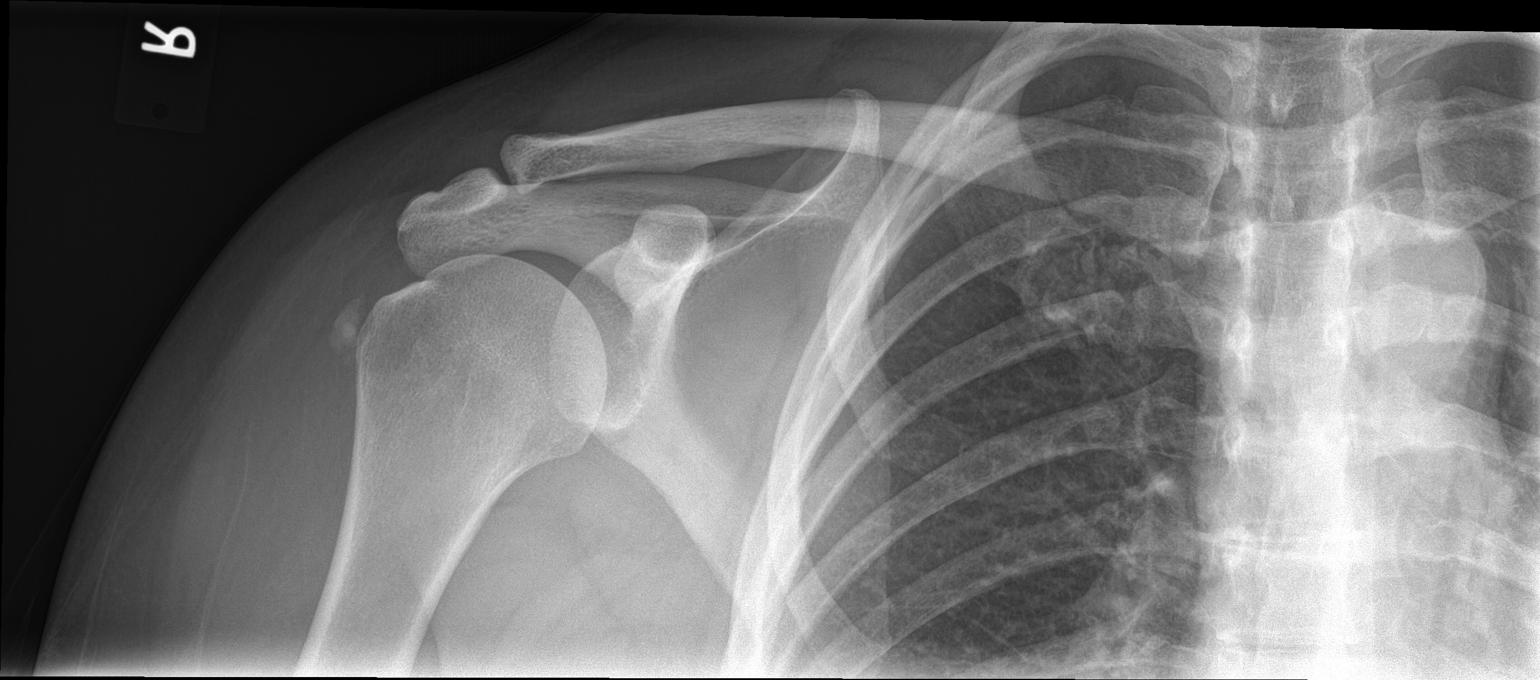

[shoulder obl]
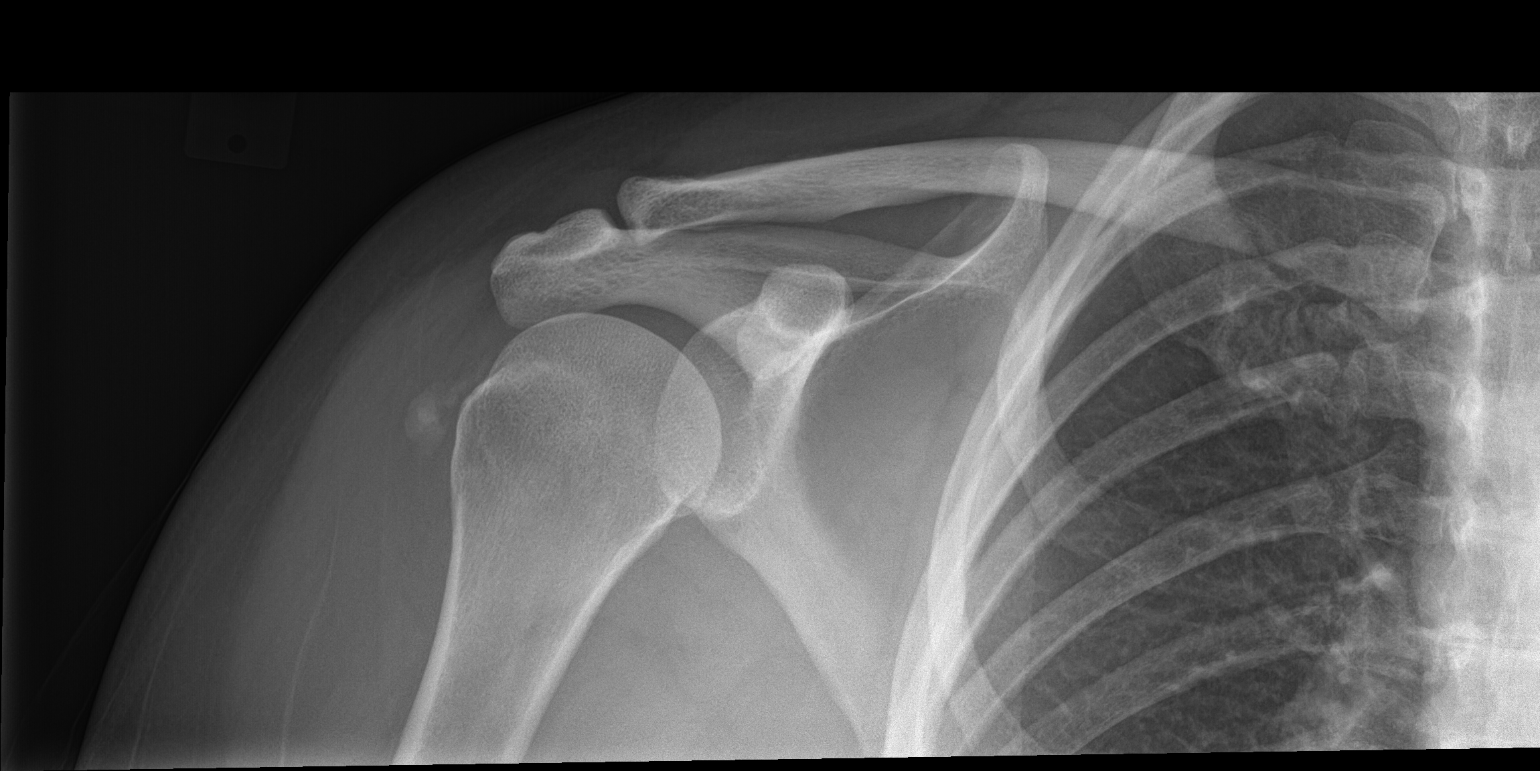

[shoulder axial]
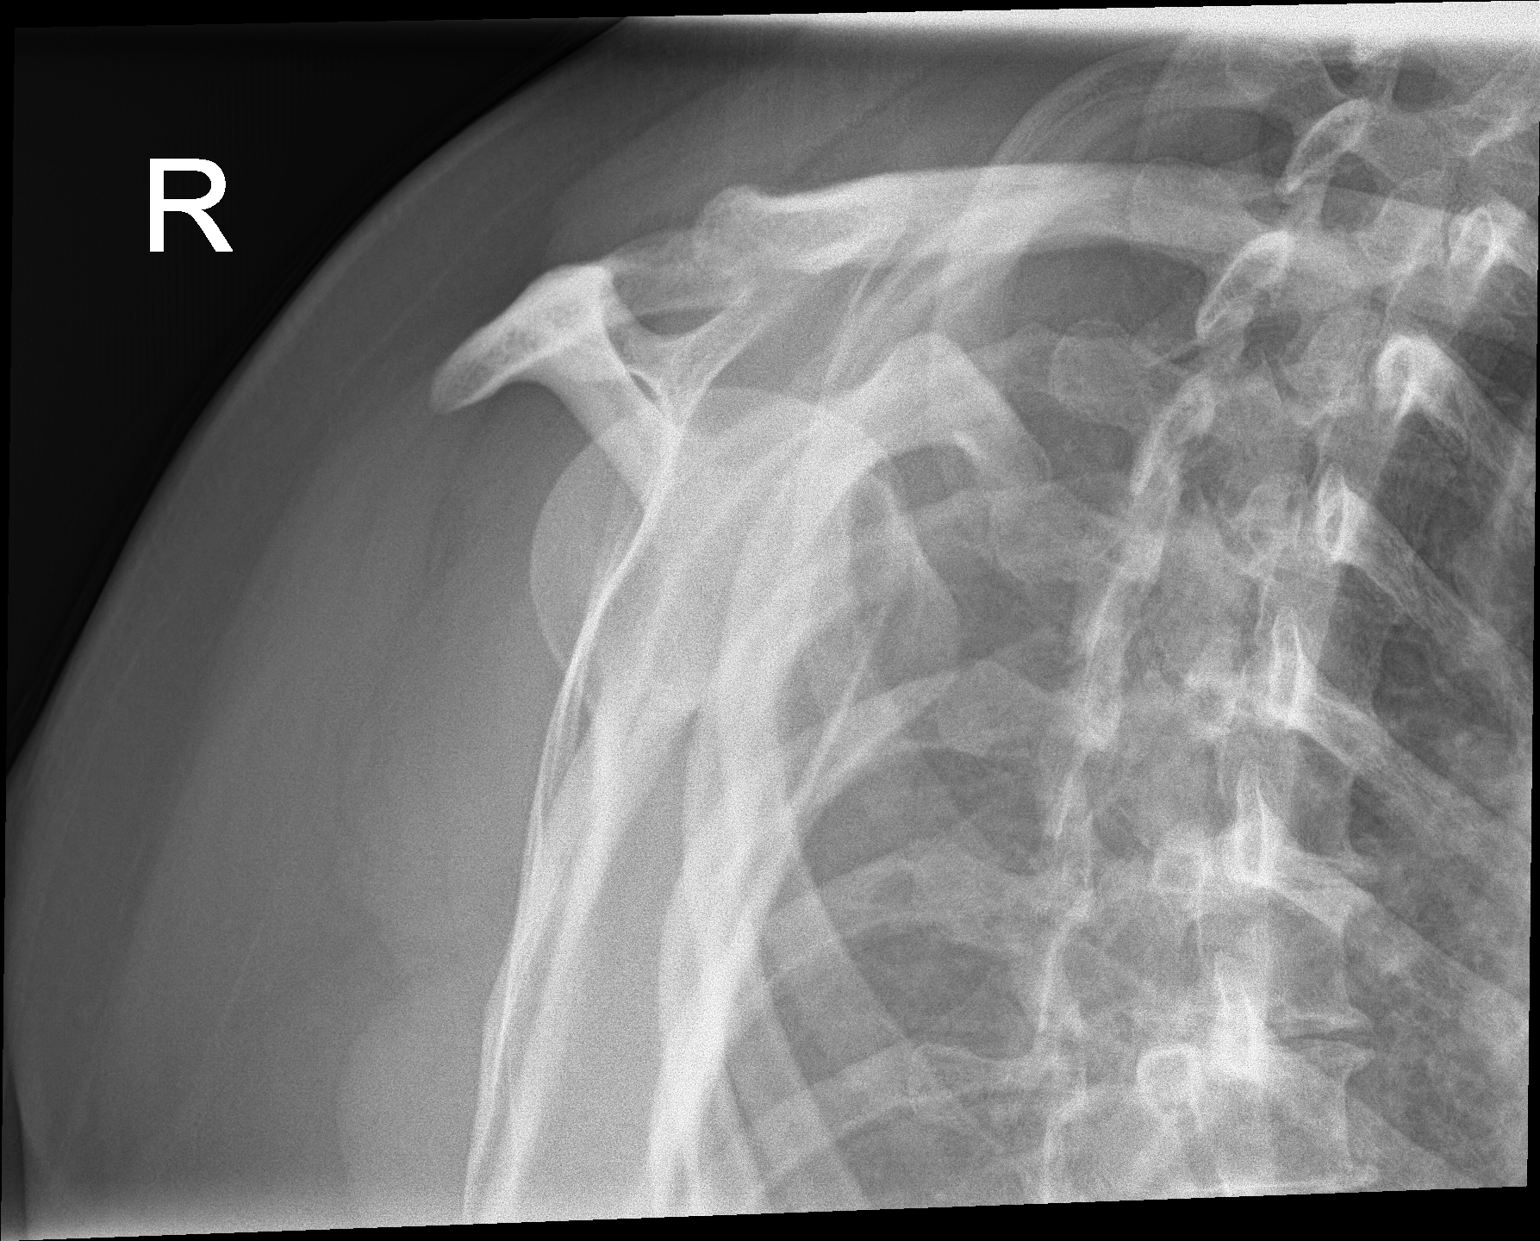

[3 of 3 positions shown; findings below may reference images not displayed]

FINDINGS: Three views of the right shoulder submitted. No acute fracture or
subluxation. AC joint and glenohumeral joint are preserved. There is
a soft tissue calcification just lateral to greater humeral
tuberosity suspicious for calcific tendinosis. Measures 1.2 cm.
IMPRESSION: No acute fracture or subluxation. Calcification adjacent to humeral
head suspicious for calcific tendinosis.

## 2018-03-18 ENCOUNTER — Encounter: Payer: Self-pay | Admitting: Physician Assistant

## 2018-03-18 ENCOUNTER — Ambulatory Visit: Payer: Medicaid Other | Admitting: Physician Assistant

## 2018-03-18 VITALS — BP 133/89 | HR 68 | Temp 98.4°F | Ht 63.0 in | Wt 216.8 lb

## 2018-03-18 DIAGNOSIS — B079 Viral wart, unspecified: Secondary | ICD-10-CM

## 2018-03-18 DIAGNOSIS — M722 Plantar fascial fibromatosis: Secondary | ICD-10-CM

## 2018-03-18 MED ORDER — DICLOFENAC SODIUM 75 MG PO TBEC: 75.0000 mg | DELAYED_RELEASE_TABLET | Freq: Two times a day (BID) | ORAL | 1 refills | Status: DC

## 2018-03-18 NOTE — Patient Instructions (Signed)

## 2018-03-18 NOTE — Progress Notes (Signed)
BP 133/89   Pulse 68   Temp 98.4 F (36.9 C) (Oral)   Ht 5\' 3"  (1.6 m)   Wt 216 lb 12.8 oz (98.3 kg)   BMI 38.40 kg/m     Subjective:    Patient ID: Alyssa Arellano, female    DOB: 10/11/1976, 42 y.o.   MRN: [id number]  HPI: Alyssa RankinsSamantha G Bitton is a 42 y.o. female presenting on 03/18/2018 for Foot Pain (bilateral )  Patient comes in with many weeks of bilateral heel pain and arch pain.  It does hurt somewhat into the forefoot.  At times she does wear unsupportive shoes at times.Over the weekend she went to the zoo in flip-flops.  After this occurrence she had an extreme amount of pain.  She also has some plantar warts on the left foot.  She has had one other time of having a wart on her foot.  It was many years ago.  Past Medical History:  Diagnosis Date  . Anxiety   . GERD (gastroesophageal reflux disease)   . IBS (irritable bowel syndrome)   . PONV (postoperative nausea and vomiting)   . Skin disorder    possibly psoriosis, but pt is unsure. Causes red, spotchy, dry spots on body when it gets cold.   Relevant past medical, surgical, family and social history reviewed and updated as indicated. Interim medical history since our last visit reviewed. Allergies and medications reviewed and updated. DATA REVIEWED: CHART IN EPIC  Family History reviewed for pertinent findings.  Review of Systems  Constitutional: Negative.  Negative for activity change, fatigue and fever.  HENT: Negative.   Eyes: Negative.   Respiratory: Negative.  Negative for cough.   Cardiovascular: Negative.  Negative for chest pain.  Gastrointestinal: Negative.  Negative for abdominal pain.  Endocrine: Negative.   Genitourinary: Negative.  Negative for dysuria.  Musculoskeletal: Positive for arthralgias, gait problem, joint swelling and myalgias.  Skin: Negative.     Allergies as of 03/18/2018      Reactions   Penicillins       Medication List        Accurate as of 03/18/18  8:48 AM. Always use your most  recent med list.          acetaminophen 500 MG tablet Commonly known as:  TYLENOL Take 1,000 mg by mouth every 6 (six) hours as needed for moderate pain.   calcium carbonate 750 MG chewable tablet Commonly known as:  TUMS EX Chew 1 tablet by mouth as needed for heartburn.   clidinium-chlordiazePOXIDE 5-2.5 MG capsule Commonly known as:  LIBRAX TAKE ONE CAPSULE BY MOUTH BEFORE MEALS AS NEEDED   diclofenac 75 MG EC tablet Commonly known as:  VOLTAREN Take 1 tablet (75 mg total) by mouth 2 (two) times daily.   fluticasone 50 MCG/ACT nasal spray Commonly known as:  FLONASE Place 2 sprays into both nostrils daily.   naproxen 500 MG tablet Commonly known as:  NAPROSYN TAKE 1 TABLET (500 MG TOTAL) BY MOUTH 2 (TWO) TIMES DAILY WITH A MEAL.   omeprazole 20 MG capsule Commonly known as:  PRILOSEC TAKE 1 CAPSULE (20 MG TOTAL) BY MOUTH DAILY.          Objective:    BP 133/89   Pulse 68   Temp 98.4 F (36.9 C) (Oral)   Ht 5\' 3"  (1.6 m)   Wt 216 lb 12.8 oz (98.3 kg)   BMI 38.40 kg/m    Allergies  Allergen Reactions  .  Penicillins     Wt Readings from Last 3 Encounters:  03/18/18 216 lb 12.8 oz (98.3 kg)  03/16/17 214 lb 3.2 oz (97.2 kg)  08/30/15 220 lb (99.8 kg)    Physical Exam  Constitutional: She is oriented to person, place, and time. She appears well-developed and well-nourished.  HENT:  Head: Normocephalic and atraumatic.  Eyes: Pupils are equal, round, and reactive to light. Conjunctivae and EOM are normal.  Cardiovascular: Normal rate, regular rhythm, normal heart sounds and intact distal pulses.  Pulmonary/Chest: Effort normal and breath sounds normal.  Abdominal: Soft. Bowel sounds are normal.  Musculoskeletal:       Left foot: There is tenderness and bony tenderness. There is no deformity.       Feet:  Neurological: She is alert and oriented to person, place, and time. She has normal reflexes.  Skin: Skin is warm and dry. No rash noted.  Warts on  left plantar surface  Psychiatric: She has a normal mood and affect. Her behavior is normal. Judgment and thought content normal.        Assessment & Plan:   1. Plantar fasciitis - diclofenac (VOLTAREN) 75 MG EC tablet; Take 1 tablet (75 mg total) by mouth 2 (two) times daily.  Dispense: 60 tablet; Refill: 1  2. Viral warts, unspecified type - Ambulatory referral to Dermatology   Continue all other maintenance medications as listed above.  Follow up plan: Return in about 1 month (around 04/15/2018) for recheck.  Educational handout given for plantar fasciitis exercises  Remus Loffler PA-C [facility] [address]  [place], [place] [address] [phone]   03/18/2018, 8:48 AM

## 2018-04-18 ENCOUNTER — Ambulatory Visit: Payer: Medicaid Other | Admitting: Physician Assistant

## 2018-04-18 ENCOUNTER — Encounter: Payer: Self-pay | Admitting: Physician Assistant

## 2018-04-18 VITALS — BP 139/87 | HR 59 | Temp 99.1°F | Ht 63.0 in | Wt 216.6 lb

## 2018-04-18 DIAGNOSIS — M722 Plantar fascial fibromatosis: Secondary | ICD-10-CM | POA: Diagnosis not present

## 2018-04-18 DIAGNOSIS — J029 Acute pharyngitis, unspecified: Secondary | ICD-10-CM | POA: Diagnosis not present

## 2018-04-18 MED ORDER — DICLOFENAC SODIUM 75 MG PO TBEC: 75.0000 mg | DELAYED_RELEASE_TABLET | Freq: Two times a day (BID) | ORAL | 5 refills | Status: DC

## 2018-04-18 MED ORDER — AZITHROMYCIN 250 MG PO TABS: ORAL_TABLET | ORAL | 0 refills | Status: DC

## 2018-04-18 NOTE — Progress Notes (Signed)
BP 139/87   Pulse (!) 59   Temp 99.1 F (37.3 C) (Oral)   Ht  (1.6 m)   Wt 216 lb 9.6 oz (98.2 kg)   BMI 38.37 kg/m    Subjective:    Patient ID: Alyssa Arellano, female    DOB: 11-May-1976, 42 y.o.   MRN: [id number]  HPI: Alyssa Arellano is a 42 y.o. female presenting on 04/18/2018 for Plantar Fasciitis (1 month follow up )  Patient comes in for one-month recheck on her plantar fasciitis.  She has been taking the diclofenac and wearing her shoes as much as possible.  She states there is a great improvement in her symptoms.  We have discuss ed her taking the diclofenac for a couple of more weeks.  And then trying to stop it.  She will let us know if there is a recurrence of her symptoms.  She was given stretching exercises at the last visit and is trying to do those as much as possible.  This patient has had many days of sore throat and postnasal drainage, headache at times and sinus pressure. There is copious drainage at times. Denies any fever at this time. There has been a history of sinus infections in the past.  There is cough at night. It has become more prevalent in recent days.   Past Medical History:  Diagnosis Date  . Anxiety   . GERD (gastroesophageal reflux disease)   . IBS (irritable bowel syndrome)   . PONV (postoperative nausea and vomiting)   . Skin disorder    possibly psoriosis, but pt is unsure. Causes red, spotchy, dry spots on body when it gets cold.   Relevant past medical, surgical, family and social history reviewed and updated as indicated. Interim medical history since our last visit reviewed. Allergies and medications reviewed and updated. DATA REVIEWED: CHART IN EPIC  Family History reviewed for pertinent findings.  Review of Systems  Constitutional: Positive for chills and fatigue. Negative for activity change and appetite change.  HENT: Positive for congestion, postnasal drip and sore throat.   Eyes: Negative.   Respiratory: Positive for  cough and wheezing.   Cardiovascular: Negative.  Negative for chest pain, palpitations and leg swelling.  Gastrointestinal: Negative.   Genitourinary: Negative.   Musculoskeletal: Negative.   Skin: Negative.   Neurological: Positive for headaches.    Allergies as of 04/18/2018      Reactions   Penicillins       Medication List        Accurate as of 04/18/18 10:01 AM. Always use your most recent med list.          acetaminophen 500 MG tablet Commonly known as:  TYLENOL Take 1,000 mg by mouth every 6 (six) hours as needed for moderate pain.   azithromycin 250 MG tablet Commonly known as:  ZITHROMAX Z-PAK Take as directed   calcium carbonate 750 MG chewable tablet Commonly known as:  TUMS EX Chew 1 tablet by mouth as needed for heartburn.   diclofenac 75 MG EC tablet Commonly known as:  VOLTAREN Take 1 tablet (75 mg total) by mouth 2 (two) times daily.   omeprazole 20 MG capsule Commonly known as:  PRILOSEC TAKE 1 CAPSULE (20 MG TOTAL) BY MOUTH DAILY.          Objective:    BP 139/87   Pulse (!) 59   Temp 99.1 F (37.3 C) (Oral)   Ht  (1.6 m)  Wt 216 lb 9.6 oz (98.2 kg)   BMI 38.37 kg/m   Allergies  Allergen Reactions  . Penicillins     Wt Readings from Last 3 Encounters:  04/18/18 216 lb 9.6 oz (98.2 kg)  03/18/18 216 lb 12.8 oz (98.3 kg)  03/16/17 214 lb 3.2 oz (97.2 kg)    Physical Exam  Constitutional: She is oriented to person, place, and time. She appears well-developed and well-nourished.  HENT:  Head: Normocephalic and atraumatic.  Right Ear: Tympanic membrane and external ear normal. No middle ear effusion.  Left Ear: Tympanic membrane and external ear normal.  No middle ear effusion.  Nose: Mucosal edema and rhinorrhea present. Right sinus exhibits no maxillary sinus tenderness. Left sinus exhibits no maxillary sinus tenderness.  Mouth/Throat: Uvula is midline. Posterior oropharyngeal erythema present.  Eyes: Pupils are equal,  round, and reactive to light. Conjunctivae and EOM are normal. Right eye exhibits no discharge. Left eye exhibits no discharge.  Neck: Normal range of motion.  Cardiovascular: Normal rate, regular rhythm and normal heart sounds.  Pulmonary/Chest: Effort normal and breath sounds normal. No respiratory distress. She has no wheezes.  Abdominal: Soft.  Lymphadenopathy:    She has no cervical adenopathy.  Neurological: She is alert and oriented to person, place, and time.  Skin: Skin is warm and dry.  Psychiatric: She has a normal mood and affect.        Assessment & Plan:   1. Plantar fasciitis - diclofenac (VOLTAREN) 75 MG EC tablet; Take 1 tablet (75 mg total) by mouth 2 (two) times daily.  Dispense: 60 tablet; Refill: 5  2. Pharyngitis, unspecified etiology ZPAK as directed - Take meds as prescribed - Use a cool mist humidifier  -Use saline nose sprays frequently -Force fluids -For any cough or congestion  Use plain Mucinex- regular strength or max strength is fine -For fever or aces or pains- take tylenol or ibuprofen. -Throat lozenges if help -New toothbrush in 3 days   Continue all other maintenance medications as listed above.  Follow up plan: No follow-ups on file.  Educational handout given for survey  Remus Loffler PA-C [facility] [address]  [place], [place] [address] [phone]   04/18/2018, 10:01 AM

## 2018-04-22 ENCOUNTER — Other Ambulatory Visit: Payer: Self-pay | Admitting: Family Medicine

## 2018-04-22 ENCOUNTER — Telehealth: Payer: Self-pay

## 2018-04-22 MED ORDER — MELOXICAM 15 MG PO TABS: 15.0000 mg | ORAL_TABLET | Freq: Every day | ORAL | 5 refills | Status: DC

## 2018-04-22 NOTE — Telephone Encounter (Signed)
Medicaid non preferred Diclofenac tablets,  Preferred are Ibuprofen tab., indomethacin cap., ketorloac tab., meloxicam tab., Naproxen EC tab.,

## 2018-05-08 ENCOUNTER — Telehealth: Payer: Self-pay | Admitting: Family Medicine

## 2018-05-08 MED ORDER — AZITHROMYCIN 250 MG PO TABS: ORAL_TABLET | ORAL | 0 refills | Status: DC

## 2018-05-08 NOTE — Telephone Encounter (Signed)
Pt aware.

## 2018-05-08 NOTE — Telephone Encounter (Signed)
What symptoms do you have? Nose is clogged up  How long have you been sick? About two weeks  Have you been seen for this problem? Yes by Prudy Feeler has a lot of drainage wants antibiotic  If your provider decides to give you a prescription, which pharmacy would you like for it to be sent to? [facility]   Patient informed that this information will be sent to the clinical staff for review and that they should receive a follow up call.

## 2018-05-08 NOTE — Telephone Encounter (Signed)
Sent zpak.

## 2018-05-26 ENCOUNTER — Encounter: Payer: Self-pay | Admitting: Physician Assistant

## 2018-05-26 ENCOUNTER — Other Ambulatory Visit: Payer: Medicaid Other

## 2018-05-26 ENCOUNTER — Ambulatory Visit: Payer: Medicaid Other | Admitting: Physician Assistant

## 2018-05-26 VITALS — BP 140/100 | HR 64 | Ht 64.25 in | Wt 215.1 lb

## 2018-05-26 DIAGNOSIS — K219 Gastro-esophageal reflux disease without esophagitis: Secondary | ICD-10-CM

## 2018-05-26 DIAGNOSIS — R197 Diarrhea, unspecified: Secondary | ICD-10-CM | POA: Diagnosis not present

## 2018-05-26 DIAGNOSIS — R1030 Lower abdominal pain, unspecified: Secondary | ICD-10-CM | POA: Diagnosis not present

## 2018-05-26 MED ORDER — DICYCLOMINE HCL 10 MG PO CAPS: 10.0000 mg | ORAL_CAPSULE | Freq: Three times a day (TID) | ORAL | 2 refills | Status: DC

## 2018-05-26 NOTE — Progress Notes (Signed)
Chief Complaint: Diarrhea and lower abdominal cramping  HPI:    Alyssa Arellano is a 42 year old Caucasian female with a past medical history of IBS, GERD and anxiety, who follows with Dr. Marina Goodell and presents clinic today for a complaint of lower abdominal cramping and diarrhea.      08/10/2015 OV Dr. Marina Goodell discussed reflux and diarrhea predominant IBS she was continued on Omeprazole 20 mg daily and Librax before meals as needed.    04/18/2018 office visit PCP prescribed Azithromycin for sore throat.    Today, explains that over the past few months she has had worsened urgency and loose stools typically after eating, sometimes 10 times a day associated with lower abdominal cramping and pain which is typically better after a bowel movement.  She has been on two rounds of antibiotics for a sore throat.  Also describes worsening of her diarrhea around the start of her period which is typically very heavy at its start.  Not using her Librax anymore as this was too expensive for her.  Stopped it about a year or 2 ago ago.  Associated symptoms include occasional nausea.    Chronic reflux is controlled now on Omeprazole 20 mg daily and Ranitidine 150 mg twice daily.    Denies fever, chills, weight loss, blood in her stool, anorexia, vomiting or symptoms that awaken her at night.  Past Medical History:  Diagnosis Date  . Anxiety   . GERD (gastroesophageal reflux disease)   . IBS (irritable bowel syndrome)   . PONV (postoperative nausea and vomiting)   . Skin disorder    possibly psoriosis, but pt is unsure. Causes red, spotchy, dry spots on body when it gets cold.    Past Surgical History:  Procedure Laterality Date  . CESAREAN SECTION  2006   [facility]-Dr. Ralph Dowdy  . GANGLION CYST EXCISION Right 12/18/2013   Procedure: REMOVAL GANGLION CYST RIGHT FOOT;  Surgeon: Vickki Hearing, MD;  Location: [facility];  Service: Orthopedics;  Laterality: Right;    Current Outpatient Medications  Medication Sig  Dispense Refill  . acetaminophen (TYLENOL) 500 MG tablet Take 1,000 mg by mouth every 6 (six) hours as needed for moderate pain.    . calcium carbonate (TUMS EX) 750 MG chewable tablet Chew 1 tablet by mouth as needed for heartburn.    . meloxicam (MOBIC) 15 MG tablet Take 1 tablet (15 mg total) by mouth daily. For joint and muscle pain 30 tablet 5  . omeprazole (PRILOSEC) 20 MG capsule TAKE 1 CAPSULE (20 MG TOTAL) BY MOUTH DAILY. 30 capsule 2  . ranitidine (ZANTAC) 150 MG tablet Take 1 tablet by mouth 2 (two) times daily.  1   No current facility-administered medications for this visit.     Allergies as of 05/26/2018 - Review Complete 05/26/2018  Allergen Reaction Noted  . Penicillins  12/03/2011    History reviewed. No pertinent family history.  Social History   Socioeconomic History  . Marital status: Single    Spouse name: Not on file  . Number of children: Not on file  . Years of education: Not on file  . Highest education level: Not on file  Occupational History  . Not on file  Social Needs  . Financial resource strain: Not on file  . Food insecurity:    Worry: Not on file    Inability: Not on file  . Transportation needs:    Medical: Not on file    Non-medical: Not on file  Tobacco  Use  . Smoking status: Never Smoker  . Smokeless tobacco: Never Used  Substance and Sexual Activity  . Alcohol use: No  . Drug use: No  . Sexual activity: Yes    Birth control/protection: None  Lifestyle  . Physical activity:    Days per week: Not on file    Minutes per session: Not on file  . Stress: Not on file  Relationships  . Social connections:    Talks on phone: Not on file    Gets together: Not on file    Attends religious service: Not on file    Active member of club or organization: Not on file    Attends meetings of clubs or organizations: Not on file    Relationship status: Not on file  . Intimate partner violence:    Fear of current or ex partner: Not on file     Emotionally abused: Not on file    Physically abused: Not on file    Forced sexual activity: Not on file  Other Topics Concern  . Not on file  Social History Narrative  . Not on file    Review of Systems:    Constitutional: No weight loss, fever or chills Cardiovascular: No chest pain Respiratory: No SOB  Gastrointestinal: See HPI and otherwise negative Psychiatric: +anxiety   Physical Exam:  Vital signs: BP (!) 140/100 (BP Location: Left Arm, Patient Position: Sitting, Cuff Size: Normal)   Pulse 64   Ht 5' 4.25" (1.632 m) Comment: height measured without shoes  Wt 215 lb 2 oz (97.6 kg)   LMP 04/23/2018   BMI 36.64 kg/m   Constitutional:   Pleasant overweight Caucasian female appears to be in NAD, Well developed, Well nourished, alert and cooperative Respiratory: Respirations even and unlabored. Lungs clear to auscultation bilaterally.   No wheezes, crackles, or rhonchi.  Cardiovascular: Normal S1, S2. No MRG. Regular rate and rhythm. No peripheral edema, cyanosis or pallor.  Gastrointestinal:  Soft, nondistended, mild b/l lower abdominal ttp. No rebound or guarding. Normal bowel sounds. No appreciable masses or hepatomegaly. Psychiatric:  Demonstrates good judgement and reason without abnormal affect or behaviors.  No recent labs or imaging.  Assessment: 1.  Diarrhea: History of IBS-D controlled on Librax in the past, now too expensive, recent use of antibiotics and increase in loose stools; consider most likely IBS-D vs infectious cause 2.  Lower abdominal cramping: Likely with diarrhea above, most consistent with IBS-D 3.  GERD: Controlled on Omeprazole and Ranitidine  Plan: 1.  Reflux controlled on Omeprazole 20 mg daily and Ranitidine 150 mg twice daily.  Continue. 2.  Recent use of antibiotics with an increase in diarrhea, history of IBS-D.  At this time will check stool studies to include C. difficile, O&P and GI pathogen panel. 3.  Prescribed Dicyclomine 10 mg 4  times daily, 20 to 30 minutes before meals and at bedtime #120 with a refill.  Did discuss with patient that if this does not seem to be strong enough we can increase dosage.  If this is too expensive for her we can try Hyoscyamine. 4.  Patient to follow in clinic in 6-8 weeks with me.  Hyacinth MeekerJennifer Lemmon, PA-C [facility] 05/26/2018, 9:21 AM  Cc: Mechele ClaudeStacks, [place], MD

## 2018-05-26 NOTE — Patient Instructions (Addendum)
If you are age 40 or older, your body mass index should be between 23-30. Your Body mass index is 36.64 kg/m. If this is out of the aforementioned range listed, please consider follow up with your Primary Care Provider.  If you are age 40 or younger, your body mass index should be between 19-25. Your Body mass index is 36.64 kg/m. If this is out of the aformentioned range listed, please consider follow up with your Primary Care Provider.   Your provider has requested that you go to the basement level for lab work before leaving today. Press "B" on the elevator. The lab is located at the first door on the left as you exit the elevator.  We have sent the following medications to your pharmacy for you to pick up at your convenience: Dicyclomine  Follow up with me on June 11, 2018 at 8:45 am.  Thank you for choosing me and [facility].  Hyacinth MeekerJennifer Lemmon, PA-C

## 2018-05-29 ENCOUNTER — Other Ambulatory Visit: Payer: Medicaid Other

## 2018-05-29 DIAGNOSIS — R197 Diarrhea, unspecified: Secondary | ICD-10-CM

## 2018-05-29 DIAGNOSIS — R1030 Lower abdominal pain, unspecified: Secondary | ICD-10-CM

## 2018-05-29 DIAGNOSIS — K219 Gastro-esophageal reflux disease without esophagitis: Secondary | ICD-10-CM

## 2018-06-02 LAB — OVA AND PARASITE EXAMINATION
CONCENTRATE RESULT:: NONE SEEN
MICRO NUMBER:: 90739474
SPECIMEN QUALITY: ADEQUATE
TRICHROME RESULT:: NONE SEEN

## 2018-06-02 LAB — CLOSTRIDIUM DIFFICILE TOXIN B, QUALITATIVE, REAL-TIME PCR: Toxigenic C. Difficile by PCR: NOT DETECTED

## 2018-06-02 LAB — GASTROINTESTINAL PATHOGEN PANEL PCR
C. difficile Tox A/B, PCR: NOT DETECTED
CRYPTOSPORIDIUM, PCR: NOT DETECTED
Campylobacter, PCR: NOT DETECTED
E coli (ETEC) LT/ST PCR: NOT DETECTED
E coli (STEC) stx1/stx2, PCR: NOT DETECTED
E coli 0157, PCR: NOT DETECTED
Giardia lamblia, PCR: NOT DETECTED
Norovirus, PCR: NOT DETECTED
ROTAVIRUS, PCR: NOT DETECTED
SALMONELLA, PCR: NOT DETECTED
Shigella, PCR: NOT DETECTED

## 2018-06-02 NOTE — Progress Notes (Signed)
Assessment and plan reviewed 

## 2018-06-05 DIAGNOSIS — B070 Plantar wart: Secondary | ICD-10-CM | POA: Diagnosis not present

## 2018-06-11 ENCOUNTER — Ambulatory Visit: Payer: Medicaid Other | Admitting: Physician Assistant

## 2018-06-11 ENCOUNTER — Encounter: Payer: Self-pay | Admitting: Physician Assistant

## 2018-06-11 VITALS — BP 112/62 | HR 74 | Ht 64.25 in | Wt 215.0 lb

## 2018-06-11 DIAGNOSIS — K219 Gastro-esophageal reflux disease without esophagitis: Secondary | ICD-10-CM

## 2018-06-11 DIAGNOSIS — K580 Irritable bowel syndrome with diarrhea: Secondary | ICD-10-CM

## 2018-06-11 MED ORDER — OMEPRAZOLE 20 MG PO CPDR: 20.0000 mg | DELAYED_RELEASE_CAPSULE | Freq: Two times a day (BID) | ORAL | 3 refills | Status: DC

## 2018-06-11 MED ORDER — DICYCLOMINE HCL 20 MG PO TABS: 20.0000 mg | ORAL_TABLET | Freq: Three times a day (TID) | ORAL | 3 refills | Status: DC

## 2018-06-11 NOTE — Patient Instructions (Addendum)
Increase Dicyclomine to 20 mg before meals and at bedtime.   Increase Omeprazole 20 mg twice a day.

## 2018-06-11 NOTE — Progress Notes (Signed)
Assessment and plans reviewed  

## 2018-06-11 NOTE — Progress Notes (Signed)
Chief Complaint: Follow-up diarrhea and lower abdominal cramping  HPI:    Alyssa Arellano is a 42 year old Caucasian female with a past medical history of IBS, GERD and anxiety, follows with Dr. Marina GoodellPerry and returns clinic today for follow-up of her lower abdominal cramping and diarrhea.    Office visit 05/26/2018 described sometimes 10 times a day loose stools typically after eating associated lower abdominal cramping, also reflux though this was controlled on omeprazole 20 mg and ranitidine 150 mg twice daily.  Started on dicyclomine 10 mg 4 times a day and did stool studies including C. difficile, O&P and GI pathogen panel.  Stool studies returned negative.    Today, presents with her husband, explains that she has been taking her Dicyclomine and believes that this has helped her "quite a lot".  Still has some days of cramping and loose stools but this is typically when she is getting ready to go somewhere away from her home.  Overall she is feeling better "most of the time".    Continues on Omeprazole 20 mg daily and Ranitidine 150 mg twice daily.  Does have some breakthrough reflux symptoms.    Denies fever, chills, weight loss, blood in her stool or symptoms that awaken her at night.  Past Medical History:  Diagnosis Date  . Anxiety   . GERD (gastroesophageal reflux disease)   . IBS (irritable bowel syndrome)   . PONV (postoperative nausea and vomiting)   . Skin disorder    possibly psoriosis, but pt is unsure. Causes red, spotchy, dry spots on body when it gets cold.    Past Surgical History:  Procedure Laterality Date  . CESAREAN SECTION  2006   [facility]  . GANGLION CYST EXCISION Right 12/18/2013   Procedure: REMOVAL GANGLION CYST RIGHT FOOT;  Surgeon: Vickki HearingStanley E Harrison, MD;  Location: [facility];  Service: Orthopedics;  Laterality: Right;    Current Outpatient Medications  Medication Sig Dispense Refill  . acetaminophen (TYLENOL) 500 MG tablet Take 1,000 mg by mouth every 6  (six) hours as needed for moderate pain.    . calcium carbonate (TUMS EX) 750 MG chewable tablet Chew 1 tablet by mouth as needed for heartburn.    . dicyclomine (BENTYL) 10 MG capsule Take 1 capsule (10 mg total) by mouth 4 (four) times daily -  before meals and at bedtime. Take 20-30 minutes before meals and bedtime. 120 capsule 2  . meloxicam (MOBIC) 15 MG tablet Take 1 tablet (15 mg total) by mouth daily. For joint and muscle pain 30 tablet 5  . omeprazole (PRILOSEC) 20 MG capsule TAKE 1 CAPSULE (20 MG TOTAL) BY MOUTH DAILY. 30 capsule 2  . ranitidine (ZANTAC) 150 MG tablet Take 1 tablet by mouth 2 (two) times daily.  1   No current facility-administered medications for this visit.     Allergies as of 06/11/2018 - Review Complete 06/11/2018  Allergen Reaction Noted  . Penicillins  12/03/2011    History reviewed. No pertinent family history.  Social History   Socioeconomic History  . Marital status: Single    Spouse name: Not on file  . Number of children: Not on file  . Years of education: Not on file  . Highest education level: Not on file  Occupational History  . Not on file  Social Needs  . Financial resource strain: Not on file  . Food insecurity:    Worry: Not on file    Inability: Not on file  . Transportation  needs:    Medical: Not on file    Non-medical: Not on file  Tobacco Use  . Smoking status: Never Smoker  . Smokeless tobacco: Never Used  Substance and Sexual Activity  . Alcohol use: No  . Drug use: No  . Sexual activity: Yes    Birth control/protection: None  Lifestyle  . Physical activity:    Days per week: Not on file    Minutes per session: Not on file  . Stress: Not on file  Relationships  . Social connections:    Talks on phone: Not on file    Gets together: Not on file    Attends religious service: Not on file    Active member of club or organization: Not on file    Attends meetings of clubs or organizations: Not on file    Relationship  status: Not on file  . Intimate partner violence:    Fear of current or ex partner: Not on file    Emotionally abused: Not on file    Physically abused: Not on file    Forced sexual activity: Not on file  Other Topics Concern  . Not on file  Social History Narrative  . Not on file    Review of Systems:    Constitutional: No weight loss, fever or chills Cardiovascular: No chest pain   Respiratory: No SOB Gastrointestinal: See HPI and otherwise negative   Physical Exam:  Vital signs: BP 112/62   Pulse 74   Ht 5' 4.25" (1.632 m)   Wt 215 lb (97.5 kg)   BMI 36.62 kg/m   Constitutional:   Pleasant Caucasian female appears to be in NAD, Well developed, Well nourished, alert and cooperative Respiratory: Respirations even and unlabored. Lungs clear to auscultation bilaterally.   No wheezes, crackles, or rhonchi.  Cardiovascular: Normal S1, S2. No MRG. Regular rate and rhythm. No peripheral edema, cyanosis or pallor.  Gastrointestinal:  Soft, nondistended, nontender. No rebound or guarding. Normal bowel sounds. No appreciable masses or hepatomegaly. Psychiatric:Demonstrates good judgement and reason without abnormal affect or behaviors.  No recent bloodwork.  Assessment: 1.  IBS-D: Under better control with Dicyclomine 10 mg but patient still having some symptoms 2.  GERD: Some breakthrough with Omeprazole 20 mg daily and Ranitidine 150 mg twice daily  Plan: 1.  Increased Omeprazole to 20 mg twice daily, 30-60 minutes before breakfast and dinner #60 with 3 refills 2.  Increase Dicyclomine to 20 mg 4 times daily, 20-30 minutes before meals and at bedtime.  #120 with 2 refills 3.  Discussed with the patient that if she is going out she can take an Imodium as well to help if needed. 4.  Patient will follow in clinic with me in 2-3 months or sooner if necessary.  Hyacinth Meeker, PA-C [facility] 06/11/2018, 8:52 AM  Cc: Mechele Claude, MD

## 2018-06-20 ENCOUNTER — Ambulatory Visit: Payer: Medicaid Other | Admitting: Family Medicine

## 2018-06-20 ENCOUNTER — Encounter: Payer: Self-pay | Admitting: Family Medicine

## 2018-06-20 VITALS — BP 157/100 | HR 69 | Temp 98.5°F | Ht 64.25 in | Wt 216.8 lb

## 2018-06-20 DIAGNOSIS — L239 Allergic contact dermatitis, unspecified cause: Secondary | ICD-10-CM | POA: Diagnosis not present

## 2018-06-20 DIAGNOSIS — J029 Acute pharyngitis, unspecified: Secondary | ICD-10-CM | POA: Diagnosis not present

## 2018-06-20 MED ORDER — FLUOCINONIDE-E 0.05 % EX CREA: 1.0000 "application " | TOPICAL_CREAM | Freq: Two times a day (BID) | CUTANEOUS | 5 refills | Status: DC

## 2018-06-20 NOTE — Progress Notes (Signed)
Chief Complaint  Patient presents with  . rash with itching    HPI  Patient presents today for pruritic rash limited to the abdomen present for several days not responding to over-the-counter treatments with calamine or Benadryl.  It does not have a ringlike target appearance.  It has not been associated with fever chills sweats nausea cough or other systemic symptoms. Patient also reports 2 days of sore throat getting worse.  Again no fever chills or sweats.  No earache no congestion no cough. PMH: Smoking status noted ROS: Per HPI  Objective: BP (!) 157/100   Pulse 69   Temp 98.5 F (36.9 C) (Oral)   Ht 5' 4.25" (1.632 m)   Wt 216 lb 12.8 oz (98.3 kg)   BMI 36.92 kg/m  Gen: NAD, alert, cooperative with exam HEENT: NCAT, EOMI, PERRL.  The pharynx has minimal erythema.  Strep swab and respiratory culture obtained CV: RRR, good S1/S2, no murmur Resp: CTABL, no wheezes, non-labored Skin: Blanching maculopapular erythema noted scattered over the abdomen. Ext: No edema, warm Neuro: Alert and oriented, No gross deficits  Assessment and plan:  1. Pharyngitis, unspecified etiology   2. Allergic dermatitis     Meds ordered this encounter  Medications  . fluocinonide-emollient (LIDEX-E) 0.05 % cream    Sig: Apply 1 application topically 2 (two) times daily. To affected areas    Dispense:  60 g    Refill:  5    Orders Placed This Encounter  Procedures  . Upper Respiratory Culture, Routine    Follow up as needed.  Mechele ClaudeWarren Timur Nibert, MD

## 2018-06-22 LAB — UPPER RESPIRATORY CULTURE, ROUTINE

## 2018-06-23 ENCOUNTER — Other Ambulatory Visit: Payer: Self-pay | Admitting: Family Medicine

## 2018-06-23 MED ORDER — AZITHROMYCIN 250 MG PO TABS: ORAL_TABLET | ORAL | 0 refills | Status: DC

## 2018-06-24 ENCOUNTER — Encounter: Payer: Self-pay | Admitting: Family Medicine

## 2018-06-24 ENCOUNTER — Other Ambulatory Visit: Payer: Self-pay | Admitting: Family Medicine

## 2018-06-24 ENCOUNTER — Telehealth: Payer: Self-pay

## 2018-06-24 MED ORDER — BETAMETHASONE DIPROPIONATE 0.05 % EX CREA: TOPICAL_CREAM | Freq: Two times a day (BID) | CUTANEOUS | 0 refills | Status: DC

## 2018-06-24 NOTE — Telephone Encounter (Signed)
Medicaid non preferred Fluocinonide cream  Preferred are betamethasone cream/ointment and triamcinolone cream/ointment

## 2018-06-24 NOTE — Telephone Encounter (Signed)
I sent in the requested prescription 

## 2018-06-26 ENCOUNTER — Telehealth: Payer: Self-pay

## 2018-06-26 NOTE — Telephone Encounter (Signed)
You changed med to Betamethasone Dipropionate cream  Preferred is betamethasone valerate cream

## 2018-06-27 ENCOUNTER — Other Ambulatory Visit: Payer: Self-pay | Admitting: Family Medicine

## 2018-06-27 MED ORDER — BETAMETHASONE VALERATE 0.1 % EX CREA: TOPICAL_CREAM | Freq: Two times a day (BID) | CUTANEOUS | 0 refills | Status: DC

## 2018-06-27 MED ORDER — BETAMETHASONE VALERATE 0.1 % EX OINT: 1.0000 "application " | TOPICAL_OINTMENT | Freq: Two times a day (BID) | CUTANEOUS | 0 refills | Status: DC

## 2018-06-27 NOTE — Telephone Encounter (Signed)
I sent in the requested prescription 

## 2018-07-24 DIAGNOSIS — B070 Plantar wart: Secondary | ICD-10-CM | POA: Diagnosis not present

## 2018-09-30 ENCOUNTER — Other Ambulatory Visit: Payer: Self-pay | Admitting: Physician Assistant

## 2018-10-22 ENCOUNTER — Other Ambulatory Visit: Payer: Self-pay | Admitting: Physician Assistant

## 2018-10-26 ENCOUNTER — Other Ambulatory Visit: Payer: Self-pay | Admitting: Family Medicine

## 2018-12-20 ENCOUNTER — Ambulatory Visit: Payer: Medicaid Other | Admitting: Physician Assistant

## 2018-12-20 VITALS — BP 145/92 | HR 72 | Temp 97.8°F | Ht 64.25 in | Wt 214.2 lb

## 2018-12-20 DIAGNOSIS — J0110 Acute frontal sinusitis, unspecified: Secondary | ICD-10-CM

## 2018-12-20 DIAGNOSIS — R030 Elevated blood-pressure reading, without diagnosis of hypertension: Secondary | ICD-10-CM | POA: Diagnosis not present

## 2018-12-20 MED ORDER — AZITHROMYCIN 250 MG PO TABS: ORAL_TABLET | ORAL | 0 refills | Status: DC

## 2018-12-20 MED ORDER — HYDROCHLOROTHIAZIDE 25 MG PO TABS: 25.0000 mg | ORAL_TABLET | Freq: Every day | ORAL | 0 refills | Status: DC

## 2018-12-20 NOTE — Patient Instructions (Signed)
DASH Eating Plan  DASH stands for "Dietary Approaches to Stop Hypertension." The DASH eating plan is a healthy eating plan that has been shown to reduce high blood pressure (hypertension). It may also reduce your risk for type 2 diabetes, heart disease, and stroke. The DASH eating plan may also help with weight loss.  What are tips for following this plan?    General guidelines   Avoid eating more than 2,300 mg (milligrams) of salt (sodium) a day. If you have hypertension, you may need to reduce your sodium intake to 1,500 mg a day.   Limit alcohol intake to no more than 1 drink a day for nonpregnant women and 2 drinks a day for men. One drink equals 12 oz of beer, 5 oz of wine, or 1 oz of hard liquor.   Work with your health care provider to maintain a healthy body weight or to lose weight. Ask what an ideal weight is for you.   Get at least 30 minutes of exercise that causes your heart to beat faster (aerobic exercise) most days of the week. Activities may include walking, swimming, or biking.   Work with your health care provider or diet and nutrition specialist (dietitian) to adjust your eating plan to your individual calorie needs.  Reading food labels     Check food labels for the amount of sodium per serving. Choose foods with less than 5 percent of the Daily Value of sodium. Generally, foods with less than 300 mg of sodium per serving fit into this eating plan.   To find whole grains, look for the word "whole" as the first word in the ingredient list.  Shopping   Buy products labeled as "low-sodium" or "no salt added."   Buy fresh foods. Avoid canned foods and premade or frozen meals.  Cooking   Avoid adding salt when cooking. Use salt-free seasonings or herbs instead of table salt or sea salt. Check with your health care provider or pharmacist before using salt substitutes.   Do not fry foods. Cook foods using healthy methods such as baking, boiling, grilling, and broiling instead.   Cook with  heart-healthy oils, such as olive, canola, soybean, or sunflower oil.  Meal planning   Eat a balanced diet that includes:  ? 5 or more servings of fruits and vegetables each day. At each meal, try to fill half of your plate with fruits and vegetables.  ? Up to 6-8 servings of whole grains each day.  ? Less than 6 oz of lean meat, poultry, or fish each day. A 3-oz serving of meat is about the same size as a deck of cards. One egg equals 1 oz.  ? 2 servings of low-fat dairy each day.  ? A serving of nuts, seeds, or beans 5 times each week.  ? Heart-healthy fats. Healthy fats called Omega-3 fatty acids are found in foods such as flaxseeds and coldwater fish, like sardines, salmon, and mackerel.   Limit how much you eat of the following:  ? Canned or prepackaged foods.  ? Food that is high in trans fat, such as fried foods.  ? Food that is high in saturated fat, such as fatty meat.  ? Sweets, desserts, sugary drinks, and other foods with added sugar.  ? Full-fat dairy products.   Do not salt foods before eating.   Try to eat at least 2 vegetarian meals each week.   Eat more home-cooked food and less restaurant, buffet, and fast food.     When eating at a restaurant, ask that your food be prepared with less salt or no salt, if possible.  What foods are recommended?  The items listed may not be a complete list. Talk with your dietitian about what dietary choices are best for you.  Grains  Whole-grain or whole-wheat bread. Whole-grain or whole-wheat pasta. Brown rice. Oatmeal. Quinoa. Bulgur. Whole-grain and low-sodium cereals. Pita bread. Low-fat, low-sodium crackers. Whole-wheat flour tortillas.  Vegetables  Fresh or frozen vegetables (raw, steamed, roasted, or grilled). Low-sodium or reduced-sodium tomato and vegetable juice. Low-sodium or reduced-sodium tomato sauce and tomato paste. Low-sodium or reduced-sodium canned vegetables.  Fruits  All fresh, dried, or frozen fruit. Canned fruit in natural juice (without  added sugar).  Meat and other protein foods  Skinless chicken or turkey. Ground chicken or turkey. Pork with fat trimmed off. Fish and seafood. Egg whites. Dried beans, peas, or lentils. Unsalted nuts, nut butters, and seeds. Unsalted canned beans. Lean cuts of beef with fat trimmed off. Low-sodium, lean deli meat.  Dairy  Low-fat (1%) or fat-free (skim) milk. Fat-free, low-fat, or reduced-fat cheeses. Nonfat, low-sodium ricotta or cottage cheese. Low-fat or nonfat yogurt. Low-fat, low-sodium cheese.  Fats and oils  Soft margarine without trans fats. Vegetable oil. Low-fat, reduced-fat, or light mayonnaise and salad dressings (reduced-sodium). Canola, safflower, olive, soybean, and sunflower oils. Avocado.  Seasoning and other foods  Herbs. Spices. Seasoning mixes without salt. Unsalted popcorn and pretzels. Fat-free sweets.  What foods are not recommended?  The items listed may not be a complete list. Talk with your dietitian about what dietary choices are best for you.  Grains  Baked goods made with fat, such as croissants, muffins, or some breads. Dry pasta or rice meal packs.  Vegetables  Creamed or fried vegetables. Vegetables in a cheese sauce. Regular canned vegetables (not low-sodium or reduced-sodium). Regular canned tomato sauce and paste (not low-sodium or reduced-sodium). Regular tomato and vegetable juice (not low-sodium or reduced-sodium). Pickles. Olives.  Fruits  Canned fruit in a light or heavy syrup. Fried fruit. Fruit in cream or butter sauce.  Meat and other protein foods  Fatty cuts of meat. Ribs. Fried meat. Bacon. Sausage. Bologna and other processed lunch meats. Salami. Fatback. Hotdogs. Bratwurst. Salted nuts and seeds. Canned beans with added salt. Canned or smoked fish. Whole eggs or egg yolks. Chicken or turkey with skin.  Dairy  Whole or 2% milk, cream, and half-and-half. Whole or full-fat cream cheese. Whole-fat or sweetened yogurt. Full-fat cheese. Nondairy creamers. Whipped toppings.  Processed cheese and cheese spreads.  Fats and oils  Butter. Stick margarine. Lard. Shortening. Ghee. Bacon fat. Tropical oils, such as coconut, palm kernel, or palm oil.  Seasoning and other foods  Salted popcorn and pretzels. Onion salt, garlic salt, seasoned salt, table salt, and sea salt. Worcestershire sauce. Tartar sauce. Barbecue sauce. Teriyaki sauce. Soy sauce, including reduced-sodium. Steak sauce. Canned and packaged gravies. Fish sauce. Oyster sauce. Cocktail sauce. Horseradish that you find on the shelf. Ketchup. Mustard. Meat flavorings and tenderizers. Bouillon cubes. Hot sauce and Tabasco sauce. Premade or packaged marinades. Premade or packaged taco seasonings. Relishes. Regular salad dressings.  Where to find more information:   National Heart, Lung, and Blood Institute: www.nhlbi.nih.gov   American Heart Association: www.heart.org  Summary   The DASH eating plan is a healthy eating plan that has been shown to reduce high blood pressure (hypertension). It may also reduce your risk for type 2 diabetes, heart disease, and stroke.   With the   DASH eating plan, you should limit salt (sodium) intake to 2,300 mg a day. If you have hypertension, you may need to reduce your sodium intake to 1,500 mg a day.   When on the DASH eating plan, aim to eat more fresh fruits and vegetables, whole grains, lean proteins, low-fat dairy, and heart-healthy fats.   Work with your health care provider or diet and nutrition specialist (dietitian) to adjust your eating plan to your individual calorie needs.  This information is not intended to replace advice given to you by your health care provider. Make sure you discuss any questions you have with your health care provider.  Document Released: 11/15/2011 Document Revised: 11/19/2016 Document Reviewed: 11/19/2016  Elsevier Interactive Patient Education  2019 [employer] Inc[employer]

## 2018-12-21 NOTE — Progress Notes (Signed)
BP (!) 145/92   Pulse 72   Temp 97.8 F (36.6 C) (Oral)   Ht 5' 4.25" (1.632 m)   Wt 214 lb 4 oz (97.2 kg)   BMI 36.49 kg/m    Subjective:    Patient ID: Alyssa Arellano, female    DOB: Aug 06, 1976, 43 y.o.   MRN: [id number]  HPI: Alyssa Arellano is a 43 y.o. female presenting on 12/20/2018 for Sinus Problem (pt here today c/o right ear pain, sinus pressure and sore throat)  This patient has had many days of sinus headache and postnasal drainage. There is copious drainage at times. Denies any fever at this time. There has been a history of sinus infections in the past.  No history of sinus surgery. There is cough at night. It has become more prevalent in recent days.   Past Medical History:  Diagnosis Date  . Anxiety   . GERD (gastroesophageal reflux disease)   . IBS (irritable bowel syndrome)   . PONV (postoperative nausea and vomiting)   . Skin disorder    possibly psoriosis, but pt is unsure. Causes red, spotchy, dry spots on body when it gets cold.   Relevant past medical, surgical, family and social history reviewed and updated as indicated. Interim medical history since our last visit reviewed. Allergies and medications reviewed and updated. DATA REVIEWED: CHART IN EPIC  Family History reviewed for pertinent findings.  Review of Systems  Constitutional: Positive for chills and fatigue. Negative for activity change, appetite change and fever.  HENT: Positive for congestion, postnasal drip, sinus pressure, sinus pain and sore throat.   Eyes: Negative.   Respiratory: Negative for cough and wheezing.   Cardiovascular: Negative.  Negative for chest pain, palpitations and leg swelling.  Gastrointestinal: Negative.   Genitourinary: Negative.   Musculoskeletal: Negative.   Skin: Negative.   Neurological: Positive for headaches.    Allergies as of 12/20/2018      Reactions   Penicillins       Medication List       Accurate as of December 20, 2018 11:59 PM. Always  use your most recent med list.        acetaminophen 500 MG tablet Commonly known as:  TYLENOL Take 1,000 mg by mouth every 6 (six) hours as needed for moderate pain.   azithromycin 250 MG tablet Commonly known as:  ZITHROMAX Z-PAK Take as directed   betamethasone valerate 0.1 % cream Commonly known as:  VALISONE APPLY TO AFFECTED AREA TWICE A DAY   calcium carbonate 750 MG chewable tablet Commonly known as:  TUMS EX Chew 1 tablet by mouth as needed for heartburn.   dicyclomine 20 MG tablet Commonly known as:  BENTYL TAKE 1 TABLET (20 MG TOTAL) BY MOUTH 4 (FOUR) TIMES DAILY - BEFORE MEALS AND AT BEDTIME.   hydrochlorothiazide 25 MG tablet Commonly known as:  HYDRODIURIL Take 1 tablet (25 mg total) by mouth daily.   meloxicam 15 MG tablet Commonly known as:  MOBIC Take 1 tablet (15 mg total) by mouth daily. For joint and muscle pain   omeprazole 20 MG capsule Commonly known as:  PRILOSEC TAKE 1 CAPSULE (20 MG TOTAL) BY MOUTH 2 (TWO) TIMES DAILY BEFORE A MEAL.          Objective:    BP (!) 145/92   Pulse 72   Temp 97.8 F (36.6 C) (Oral)   Ht 5' 4.25" (1.632 m)   Wt 214 lb 4 oz (97.2 kg)  BMI 36.49 kg/m   Allergies  Allergen Reactions  . Penicillins     Wt Readings from Last 3 Encounters:  12/20/18 214 lb 4 oz (97.2 kg)  06/20/18 216 lb 12.8 oz (98.3 kg)  06/11/18 215 lb (97.5 kg)    Physical Exam Constitutional:      Appearance: She is well-developed.  HENT:     Head: Normocephalic and atraumatic.     Right Ear: Tympanic membrane and external ear normal. No middle ear effusion.     Left Ear: Tympanic membrane and external ear normal.  No middle ear effusion.     Nose: Mucosal edema and rhinorrhea present.     Right Sinus: No maxillary sinus tenderness.     Left Sinus: No maxillary sinus tenderness.     Mouth/Throat:     Pharynx: Uvula midline. Posterior oropharyngeal erythema present.  Eyes:     General:        Right eye: No discharge.         Left eye: No discharge.     Conjunctiva/sclera: Conjunctivae normal.     Pupils: Pupils are equal, round, and reactive to light.  Neck:     Musculoskeletal: Normal range of motion.  Cardiovascular:     Rate and Rhythm: Normal rate and regular rhythm.     Heart sounds: Normal heart sounds.  Pulmonary:     Effort: Pulmonary effort is normal. No respiratory distress.     Breath sounds: Normal breath sounds. No wheezing.  Abdominal:     Palpations: Abdomen is soft.  Lymphadenopathy:     Cervical: No cervical adenopathy.  Skin:    General: Skin is warm and dry.  Neurological:     Mental Status: She is alert and oriented to person, place, and time.         Assessment & Plan:   1. Acute non-recurrent frontal sinusitis - azithromycin (ZITHROMAX Z-PAK) 250 MG tablet; Take as directed  Dispense: 6 each; Refill: 0  2. Elevated blood pressure reading - hydrochlorothiazide (HYDRODIURIL) 25 MG tablet; Take 1 tablet (25 mg total) by mouth daily.  Dispense: 90 tablet; Refill: 0    Continue all other maintenance medications as listed above.  Follow up plan: Return in about 4 weeks (around 01/17/2019) for BP recheck.  Educational handout given for survey  Remus Loffler PA-C [facility] [address]  [place], [place] [address] [phone]   12/21/2018, 3:57 PM

## 2019-01-19 ENCOUNTER — Encounter: Payer: Self-pay | Admitting: Family Medicine

## 2019-01-19 ENCOUNTER — Ambulatory Visit: Payer: Medicaid Other | Admitting: Family Medicine

## 2019-01-19 VITALS — BP 121/80 | HR 81 | Temp 97.5°F | Ht 64.25 in | Wt 216.0 lb

## 2019-01-19 DIAGNOSIS — K219 Gastro-esophageal reflux disease without esophagitis: Secondary | ICD-10-CM | POA: Diagnosis not present

## 2019-01-19 DIAGNOSIS — I10 Essential (primary) hypertension: Secondary | ICD-10-CM

## 2019-01-19 MED ORDER — HYDROCHLOROTHIAZIDE 25 MG PO TABS: 25.0000 mg | ORAL_TABLET | Freq: Every day | ORAL | 1 refills | Status: DC

## 2019-01-19 MED ORDER — MELOXICAM 15 MG PO TABS: 15.0000 mg | ORAL_TABLET | Freq: Every day | ORAL | 5 refills | Status: DC

## 2019-01-19 MED ORDER — OMEPRAZOLE 40 MG PO CPDR: 40.0000 mg | DELAYED_RELEASE_CAPSULE | Freq: Two times a day (BID) | ORAL | 5 refills | Status: DC

## 2019-01-19 NOTE — Progress Notes (Signed)
Subjective:  Patient ID: Alyssa Arellano, female    DOB: October 12, 1976  Age: 43 y.o. MRN: [id number]  CC: Medical Management of Chronic Issues (pt here today for 1 month follow up after starting on HCTZ)   HPI Alyssa Arellano presents for  follow-up of hypertension. Patient has no history of headache chest pain or shortness of breath or recent cough. Patient also denies symptoms of TIA such as focal numbness or weakness. Patient denies side effects from medication. States taking it regularly.   History Alyssa Arellano has a past medical history of Anxiety, GERD (gastroesophageal reflux disease), IBS (irritable bowel syndrome), PONV (postoperative nausea and vomiting), and Skin disorder.   She has a past surgical history that includes Cesarean section (2006) and Ganglion cyst excision (Right, 12/18/2013).   Her family history includes Cancer in her father.She reports that she has never smoked. She has never used smokeless tobacco. She reports that she does not drink alcohol or use drugs.  Current Outpatient Medications on File Prior to Visit  Medication Sig Dispense Refill  . acetaminophen (TYLENOL) 500 MG tablet Take 1,000 mg by mouth every 6 (six) hours as needed for moderate pain.    Marland Kitchen betamethasone valerate (VALISONE) 0.1 % cream APPLY TO AFFECTED AREA TWICE A DAY 30 g 0  . calcium carbonate (TUMS EX) 750 MG chewable tablet Chew 1 tablet by mouth as needed for heartburn.    . dicyclomine (BENTYL) 20 MG tablet TAKE 1 TABLET (20 MG TOTAL) BY MOUTH 4 (FOUR) TIMES DAILY - BEFORE MEALS AND AT BEDTIME. 120 tablet 3   No current facility-administered medications on file prior to visit.     ROS Review of Systems  Constitutional: Negative.   HENT: Negative.   Eyes: Negative for visual disturbance.  Respiratory: Negative for shortness of breath.   Cardiovascular: Negative for chest pain.  Gastrointestinal: Negative for abdominal pain.  Musculoskeletal: Negative for arthralgias.    Objective:    BP 121/80   Pulse 81   Temp (!) 97.5 F (36.4 C) (Oral)   Ht 5' 4.25" (1.632 m)   Wt 216 lb (98 kg)   BMI 36.79 kg/m   BP Readings from Last 3 Encounters:  01/19/19 121/80  12/20/18 (!) 145/92  06/20/18 (!) 157/100    Wt Readings from Last 3 Encounters:  01/19/19 216 lb (98 kg)  12/20/18 214 lb 4 oz (97.2 kg)  06/20/18 216 lb 12.8 oz (98.3 kg)     Physical Exam Constitutional:      General: She is not in acute distress.    Appearance: She is well-developed.  Cardiovascular:     Rate and Rhythm: Normal rate and regular rhythm.  Pulmonary:     Breath sounds: Normal breath sounds.  Musculoskeletal:        General: No swelling or tenderness.  Skin:    General: Skin is warm and dry.  Neurological:     Mental Status: She is alert and oriented to person, place, and time.       Assessment & Plan:   Alyssa Arellano was seen today for medical management of chronic issues.  Diagnoses and all orders for this visit:  Gastroesophageal reflux disease without esophagitis -     CMP14+EGFR  Essential hypertension -     hydrochlorothiazide (HYDRODIURIL) 25 MG tablet; Take 1 tablet (25 mg total) by mouth daily.  Other orders -     omeprazole (PRILOSEC) 40 MG capsule; Take 1 capsule (40 mg total) by mouth 2 (two)  times daily. On an empty stomach -     meloxicam (MOBIC) 15 MG tablet; Take 1 tablet (15 mg total) by mouth daily. For joint and muscle pain   Allergies as of 01/19/2019      Reactions   Penicillins       Medication List       Accurate as of January 19, 2019 12:14 PM. Always use your most recent med list.        acetaminophen 500 MG tablet Commonly known as:  TYLENOL Take 1,000 mg by mouth every 6 (six) hours as needed for moderate pain.   betamethasone valerate 0.1 % cream Commonly known as:  VALISONE APPLY TO AFFECTED AREA TWICE A DAY   calcium carbonate 750 MG chewable tablet Commonly known as:  TUMS EX Chew 1 tablet by mouth as needed for  heartburn.   dicyclomine 20 MG tablet Commonly known as:  BENTYL TAKE 1 TABLET (20 MG TOTAL) BY MOUTH 4 (FOUR) TIMES DAILY - BEFORE MEALS AND AT BEDTIME.   hydrochlorothiazide 25 MG tablet Commonly known as:  HYDRODIURIL Take 1 tablet (25 mg total) by mouth daily.   meloxicam 15 MG tablet Commonly known as:  MOBIC Take 1 tablet (15 mg total) by mouth daily. For joint and muscle pain   omeprazole 40 MG capsule Commonly known as:  PRILOSEC Take 1 capsule (40 mg total) by mouth 2 (two) times daily. On an empty stomach       Meds ordered this encounter  Medications  . omeprazole (PRILOSEC) 40 MG capsule    Sig: Take 1 capsule (40 mg total) by mouth 2 (two) times daily. On an empty stomach    Dispense:  60 capsule    Refill:  5  . hydrochlorothiazide (HYDRODIURIL) 25 MG tablet    Sig: Take 1 tablet (25 mg total) by mouth daily.    Dispense:  90 tablet    Refill:  1  . meloxicam (MOBIC) 15 MG tablet    Sig: Take 1 tablet (15 mg total) by mouth daily. For joint and muscle pain    Dispense:  30 tablet    Refill:  5     Follow-up: Return in about 6 months (around 07/20/2019).  Claretta Fraise, M.D.

## 2019-01-20 LAB — CMP14+EGFR
ALT: 14 IU/L (ref 0–32)
AST: 17 IU/L (ref 0–40)
Albumin/Globulin Ratio: 1.4 (ref 1.2–2.2)
Albumin: 4.2 g/dL (ref 3.8–4.8)
Alkaline Phosphatase: 84 IU/L (ref 39–117)
BUN/Creatinine Ratio: 14 (ref 9–23)
BUN: 13 mg/dL (ref 6–24)
Bilirubin Total: 0.3 mg/dL (ref 0.0–1.2)
CALCIUM: 9.3 mg/dL (ref 8.7–10.2)
CHLORIDE: 99 mmol/L (ref 96–106)
CO2: 26 mmol/L (ref 20–29)
Creatinine, Ser: 0.92 mg/dL (ref 0.57–1.00)
GFR, EST AFRICAN AMERICAN: 89 mL/min/{1.73_m2} (ref >59)
GFR, EST NON AFRICAN AMERICAN: 78 mL/min/{1.73_m2} (ref >59)
GLUCOSE: 96 mg/dL (ref 65–99)
Globulin, Total: 3.1 g/dL (ref 1.5–4.5)
Potassium: 3.7 mmol/L (ref 3.5–5.2)
Sodium: 140 mmol/L (ref 134–144)
TOTAL PROTEIN: 7.3 g/dL (ref 6.0–8.5)

## 2019-01-20 NOTE — Progress Notes (Signed)
Hello Cedella,  Your lab result is normal.Some minor variations that are not significant are commonly marked abnormal, but do not represent any medical problem for you.  Best regards, Mechele Claude, M.D.

## 2019-02-25 ENCOUNTER — Other Ambulatory Visit: Payer: Self-pay | Admitting: Family Medicine

## 2019-03-27 ENCOUNTER — Other Ambulatory Visit: Payer: Self-pay | Admitting: Physician Assistant

## 2019-03-27 MED ORDER — DICYCLOMINE HCL 20 MG PO TABS: 20.0000 mg | ORAL_TABLET | Freq: Three times a day (TID) | ORAL | 1 refills | Status: DC

## 2019-03-27 NOTE — Telephone Encounter (Signed)
Patient would like a refill for dicyclomine (BENTYL) 20 MG ° °

## 2019-03-27 NOTE — Telephone Encounter (Signed)
Dicyclomine refilled as requested. 

## 2019-06-29 ENCOUNTER — Other Ambulatory Visit: Payer: Self-pay | Admitting: Family Medicine

## 2019-07-17 ENCOUNTER — Other Ambulatory Visit: Payer: Self-pay

## 2019-07-19 ENCOUNTER — Other Ambulatory Visit: Payer: Self-pay | Admitting: Family Medicine

## 2019-07-20 ENCOUNTER — Encounter: Payer: Self-pay | Admitting: Family Medicine

## 2019-07-20 ENCOUNTER — Other Ambulatory Visit: Payer: Self-pay

## 2019-07-20 ENCOUNTER — Ambulatory Visit (INDEPENDENT_AMBULATORY_CARE_PROVIDER_SITE_OTHER): Payer: Medicaid Other | Admitting: Family Medicine

## 2019-07-20 DIAGNOSIS — J0100 Acute maxillary sinusitis, unspecified: Secondary | ICD-10-CM

## 2019-07-20 DIAGNOSIS — K2100 Gastro-esophageal reflux disease with esophagitis, without bleeding: Secondary | ICD-10-CM

## 2019-07-20 MED ORDER — PSEUDOEPHEDRINE-GUAIFENESIN ER 120-1200 MG PO TB12: 1.0000 | ORAL_TABLET | Freq: Two times a day (BID) | ORAL | 0 refills | Status: DC

## 2019-07-20 MED ORDER — FLUTICASONE PROPIONATE 50 MCG/ACT NA SUSP: 2.0000 | Freq: Every day | NASAL | 6 refills | Status: DC

## 2019-07-20 MED ORDER — SULFAMETHOXAZOLE-TRIMETHOPRIM 800-160 MG PO TABS: 1.0000 | ORAL_TABLET | Freq: Two times a day (BID) | ORAL | 0 refills | Status: DC

## 2019-07-20 NOTE — Progress Notes (Signed)
Subjective:    Patient ID: Alyssa Arellano, female    DOB: 06/15/1976, 43 y.o.   MRN: [id number]   HPI: Alyssa Arellano is a 43 y.o. female presenting for Symptoms include congestion, facial pain, nasal congestion,  post nasal drip and sinus pressure. Moderate sore throat. Right side of nose is sore. There is no fever, chills, or sweats. Onset of symptoms was a few days ago, gradually worsening since that time. Right ear feels like it is draining .   Reflux getting worse. Gets burning in her throat in spite of using PPI BID plus TUMs. Avoiding spicy foods. Has hx of IBS.    [facility] 06/20/2018 04/18/2018 03/18/2018 03/16/2017 08/30/2015  Decreased Interest 0 0 0 0 0  Down, Depressed, Hopeless 0 0 0 0 0  PHQ - 2 Score 0 0 0 0 0     Relevant past medical, surgical, family and social history reviewed and updated as indicated.  Interim medical history since our last visit reviewed. Allergies and medications reviewed and updated.  ROS:  Review of Systems  Constitutional: Negative for activity change, appetite change, chills and fever.  HENT: Positive for congestion, postnasal drip and sinus pressure. Negative for ear discharge, ear pain, hearing loss, nosebleeds, rhinorrhea, sneezing and trouble swallowing.   Respiratory: Negative for chest tightness and shortness of breath.   Cardiovascular: Negative for chest pain and palpitations.  Gastrointestinal: Positive for abdominal pain.  Genitourinary: Positive for dysuria.  Skin: Negative for rash.     Social History   Tobacco Use  Smoking Status Never Smoker  Smokeless Tobacco Never Used       Objective:     Wt Readings from Last 3 Encounters:  01/19/19 216 lb (98 kg)  12/20/18 214 lb 4 oz (97.2 kg)  06/20/18 216 lb 12.8 oz (98.3 kg)     Exam deferred. Pt. Harboring due to COVID 19. Phone visit performed.   Assessment & Plan:   1. Gastroesophageal reflux disease with esophagitis   2. Acute maxillary  sinusitis, recurrence not specified     Meds ordered this encounter  Medications  . sulfamethoxazole-trimethoprim (BACTRIM DS) 800-160 MG tablet    Sig: Take 1 tablet by mouth 2 (two) times daily. Until gone, for infection    Dispense:  20 tablet    Refill:  0  . Pseudoephedrine-Guaifenesin [phone] MG TB12    Sig: Take 1 tablet by mouth 2 (two) times daily. For congestion    Dispense:  20 tablet    Refill:  0  . fluticasone (FLONASE) 50 MCG/ACT nasal spray    Sig: Place 2 sprays into both nostrils daily.    Dispense:  16 g    Refill:  6    Orders Placed This Encounter  Procedures  . Ambulatory referral to Gastroenterology    Referral Priority:   Routine    Referral Type:   Consultation    Referral Reason:   Specialty Services Required    Referred to Provider:   Hilarie FredricksonPerry, John N, MD    Number of Visits Requested:   1      Diagnoses and all orders for this visit:  Gastroesophageal reflux disease with esophagitis -     Ambulatory referral to Gastroenterology  Acute maxillary sinusitis, recurrence not specified  Other orders -     sulfamethoxazole-trimethoprim (BACTRIM DS) 800-160 MG tablet; Take 1 tablet by mouth 2 (two) times daily. Until gone, for infection -  Pseudoephedrine-Guaifenesin [phone] MG TB12; Take 1 tablet by mouth 2 (two) times daily. For congestion -     fluticasone (FLONASE) 50 MCG/ACT nasal spray; Place 2 sprays into both nostrils daily.    Virtual Visit via telephone Note  I discussed the limitations, risks, security and privacy concerns of performing an evaluation and management service by telephone and the availability of in person appointments. The patient was identified with two identifiers. Pt.expressed understanding and agreed to proceed. Pt. Is at home. Dr. Livia Snellen is in his office.  Follow Up Instructions:   I discussed the assessment and treatment plan with the patient. The patient was provided an opportunity to ask questions and all were  answered. The patient agreed with the plan and demonstrated an understanding of the instructions.   The patient was advised to call back or seek an in-person evaluation if the symptoms worsen or if the condition fails to improve as anticipated.   Total minutes including chart review and phone contact time: 20   Follow up plan: Return in about 6 months (around 01/20/2020).  Claretta Fraise, MD [place]

## 2019-07-31 ENCOUNTER — Other Ambulatory Visit: Payer: Self-pay | Admitting: Family Medicine

## 2019-07-31 DIAGNOSIS — I10 Essential (primary) hypertension: Secondary | ICD-10-CM

## 2019-08-24 ENCOUNTER — Encounter: Payer: Self-pay | Admitting: Internal Medicine

## 2019-08-24 ENCOUNTER — Ambulatory Visit: Payer: Medicaid Other | Admitting: Internal Medicine

## 2019-08-24 VITALS — BP 128/90 | HR 72 | Temp 98.0°F | Ht 64.25 in | Wt 209.5 lb

## 2019-08-24 DIAGNOSIS — K580 Irritable bowel syndrome with diarrhea: Secondary | ICD-10-CM | POA: Diagnosis not present

## 2019-08-24 DIAGNOSIS — R1013 Epigastric pain: Secondary | ICD-10-CM

## 2019-08-24 DIAGNOSIS — K219 Gastro-esophageal reflux disease without esophagitis: Secondary | ICD-10-CM | POA: Diagnosis not present

## 2019-08-24 NOTE — Patient Instructions (Signed)
You have been scheduled for an endoscopy. Please follow written instructions given to you at your visit today. If you use inhalers (even only as needed), please bring them with you on the day of your procedure.   

## 2019-08-24 NOTE — Progress Notes (Signed)
HISTORY OF PRESENT ILLNESS:  Alyssa Arellano is a 43 y.o. female with a history of GERD, morbid obesity, diarrhea predominant irritable bowel syndrome, and anxiety who presents today for evaluation of complaints of sore throat, possible GERD, and epigastric discomfort.  She is accompanied by her female companion.  Patient last underwent upper endoscopy in May 2004 to evaluate chest pain, abdominal pain.  Examination was normal and the diagnosis of GERD given.  I last saw the patient in the office August 10, 2015 for active GERD, worsening diarrhea predominant IBS, and anxiety.  She was treated with omeprazole and Librax.  Instructed to see her PCP regarding anxiety.  Routine follow-up in 1 year recommended.  She was not seen until July 2019 by the GI physician assistant.  Problems at that time were GERD and IBS.  See that dictation.  Patient tells me that several months ago she complained of burning sensation in her throat.  She was concerned about white balls in the back of her throat.  She tells me that her PCP diagnosed GERD and increased her omeprazole from 20 mg daily to 40 mg daily and subsequently 40 mg twice daily without change in symptoms.  Throat discomfort waxes and wanes.  Apparently treated with some antibiotics as well.  She denies dysphasia.  No classic reflux symptoms on PPI.  She does mention intermittent upper abdominal discomfort and some left lower quadrant discomfort which responds to antispasmodic therapy (Bentyl).  She continues with issues with postprandial diarrhea.  She does take Mobic on a regular basis for pain.  Some nausea but no vomiting.  Significant bloating.  No weight loss.  Review of blood work from February 2020 shows normal comprehensive metabolic panel.  REVIEW OF SYSTEMS:  All non-GI ROS negative unless otherwise stated in the HPI except for cough, muscle cramps, excessive thirst, ankle swelling, skin rash, sinus and allergies  Past Medical History:  Diagnosis Date   . Anxiety   . GERD (gastroesophageal reflux disease)   . IBS (irritable bowel syndrome)   . PONV (postoperative nausea and vomiting)   . Skin disorder    possibly psoriosis, but pt is unsure. Causes red, spotchy, dry spots on body when it gets cold.    Past Surgical History:  Procedure Laterality Date  . CESAREAN SECTION  2006   [facility]-Dr. Evie Lacks  . GANGLION CYST EXCISION Right 12/18/2013   Procedure: REMOVAL GANGLION CYST RIGHT FOOT;  Surgeon: Carole Civil, MD;  Location: [facility];  Service: Orthopedics;  Laterality: Right;    Social History Alyssa Arellano  reports that she has never smoked. She has never used smokeless tobacco. She reports that she does not drink alcohol or use drugs.  family history includes Cancer in her father.  Allergies  Allergen Reactions  . Penicillins        PHYSICAL EXAMINATION: Vital signs: BP 128/90 (BP Location: Left Arm, Patient Position: Sitting, Cuff Size: Normal)   Pulse 72   Temp 98 F (36.7 C)   Ht 5' 4.25" (1.632 m)   Wt 209 lb 8 oz (95 kg)   LMP 07/13/2019   BMI 35.68 kg/m   Constitutional: Obese, generally well-appearing, no acute distress Psychiatric: alert and oriented x3, cooperative Eyes: extraocular movements intact, anicteric, conjunctiva pink Mouth: oral pharynx moist, no lesions.  Normal tonsils with normal tonsillar crypts Neck: supple no lymphadenopathy Cardiovascular: heart regular rate and rhythm, no murmur Lungs: clear to auscultation bilaterally Abdomen: soft, nontender, nondistended, no obvious ascites,  no peritoneal signs, normal bowel sounds, no organomegaly Rectal:  Omitted extremities: no clubbing, cyanosis, or lower extremity edema bilaterally Skin: no lesions on visible extremities Neuro: No focal deficits.  Cranial nerves intact  ASSESSMENT:  1.  GERD.  Classic symptoms managed with PPI 2.  Intermittent problems with complaints of sore throat.  Unlikely related to GERD 3.  Upper abdominal  discomfort in a patient on chronic NSAIDs.  Rule out ulcer 4.  Morbid obesity 5.  Diarrhea predominant IBS   PLAN:  1.  Reflux precautions with attention to weight loss 2.  Lowest dose of PPI to control classic GERD symptoms has been recommended 3.  Upper endoscopy to evaluate epigastric discomfort despite PPI therapy in a patient on chronic NSAIDs.The nature of the procedure, as well as the risks, benefits, and alternatives were carefully and thoroughly reviewed with the patient. Ample time for discussion and questions allowed. The patient understood, was satisfied, and agreed to proceed. 4.  Continue Bentyl as needed for irritable bowel syndrome type symptoms 5.  If problems with throat discomfort persist, that her PCP could consider ENT evaluation 25-minute spent face-to-face with the patient.  Greater than 50% of time used for counseling regarding GERD, throat complaints, upper abdominal pain, and IBS

## 2019-08-25 ENCOUNTER — Telehealth: Payer: Self-pay

## 2019-08-25 NOTE — Telephone Encounter (Signed)
Covid-19 screening questions   Do you now or have you had a fever in the last 14 days? NO   Do you have any respiratory symptoms of shortness of breath or cough now or in the last 14 days? NO  Do you have any family members or close contacts with diagnosed or suspected Covid-19 in the past 14 days? NO  Have you been tested for Covid-19 and found to be positive? NO        

## 2019-08-26 ENCOUNTER — Ambulatory Visit (AMBULATORY_SURGERY_CENTER): Payer: Medicaid Other | Admitting: Internal Medicine

## 2019-08-26 ENCOUNTER — Encounter: Payer: Self-pay | Admitting: Internal Medicine

## 2019-08-26 ENCOUNTER — Other Ambulatory Visit: Payer: Self-pay

## 2019-08-26 VITALS — BP 139/89 | HR 66 | Temp 98.0°F | Resp 17 | Ht 64.0 in | Wt 209.0 lb

## 2019-08-26 DIAGNOSIS — K219 Gastro-esophageal reflux disease without esophagitis: Secondary | ICD-10-CM

## 2019-08-26 DIAGNOSIS — R1013 Epigastric pain: Secondary | ICD-10-CM

## 2019-08-26 MED ORDER — SODIUM CHLORIDE 0.9 % IV SOLN: 500.0000 mL | Freq: Once | INTRAVENOUS | Status: DC

## 2019-08-26 NOTE — Patient Instructions (Signed)
YOU HAD AN ENDOSCOPIC PROCEDURE TODAY AT THE [facility]:   Refer to the procedure report that was given to you for any specific questions about what was found during the examination.  If the procedure report does not answer your questions, please call your gastroenterologist to clarify.  If you requested that your care partner not be given the details of your procedure findings, then the procedure report has been included in a sealed envelope for you to review at your convenience later.  YOU SHOULD EXPECT: Some feelings of bloating in the abdomen. Passage of more gas than usual.  Walking can help get rid of the air that was put into your GI tract during the procedure and reduce the bloating. If you had a lower endoscopy (such as a colonoscopy or flexible sigmoidoscopy) you may notice spotting of blood in your stool or on the toilet paper. If you underwent a bowel prep for your procedure, you may not have a normal bowel movement for a few days.  Please Note:  You might notice some irritation and congestion in your nose or some drainage.  This is from the oxygen used during your procedure.  There is no need for concern and it should clear up in a day or so.  SYMPTOMS TO REPORT IMMEDIATELY:   Following upper endoscopy (EGD)  Vomiting of blood or coffee ground material  New chest pain or pain under the shoulder blades  Painful or persistently difficult swallowing  New shortness of breath  Fever of 100F or higher  Black, tarry-looking stools  For urgent or emergent issues, a gastroenterologist can be reached at any hour by calling [phone].   DIET:  We do recommend a small meal at first, but then you may proceed to your regular diet.  Drink plenty of fluids but you should avoid alcoholic beverages for 24 hours.  ACTIVITY:  You should plan to take it easy for the rest of today and you should NOT DRIVE or use heavy machinery until tomorrow (because of the sedation medicines used  during the test).    FOLLOW UP: Our staff will call the number listed on your records 48-72 hours following your procedure to check on you and address any questions or concerns that you may have regarding the information given to you following your procedure. If we do not reach you, we will leave a message.  We will attempt to reach you two times.  During this call, we will ask if you have developed any symptoms of COVID 19. If you develop any symptoms (ie: fever, flu-like symptoms, shortness of breath, cough etc.) before then, please call [phone].  If you test positive for Covid 19 in the 2 weeks post procedure, please call and report this information to us.    If any biopsies were taken you will be contacted by phone or by letter within the next 1-3 weeks.  Please call us at [phone] if you have not heard about the biopsies in 3 weeks.    SIGNATURES/CONFIDENTIALITY: You and/or your care partner have signed paperwork which will be entered into your electronic medical record.  These signatures attest to the fact that that the information above on your After Visit Summary has been reviewed and is understood.  Full responsibility of the confidentiality of this discharge information lies with you and/or your care-partner. 

## 2019-08-26 NOTE — Progress Notes (Signed)
Pt's states no medical or surgical changes since previsit or office visit. 

## 2019-08-26 NOTE — Progress Notes (Signed)
To PACU, VSS. Report to [email or web address].tb 

## 2019-08-26 NOTE — Op Note (Signed)
[facility] Patient Name: Alyssa SchoolSamantha Aumiller Procedure Date: 08/26/2019 8:53 AM MRN: [id number] Endoscopist: Wilhemina BonitoJohn N. Marina GoodellPerry , MD Age: 89 Referring MD:  Date of Birth: 09/14/1976 Gender: Female Account #: [id number] Procedure:                Upper GI endoscopy Indications:              Epigastric abdominal pain. Also complains of                            chronic intermittent sore throat. She does have                            established GERD though classic symptoms controlled                            with PPI therapy Medicines:                Monitored Anesthesia Care Procedure:                Pre-Anesthesia Assessment:                           - Prior to the procedure, a History and Physical                            was performed, and patient medications and                            allergies were reviewed. The patient's tolerance of                            previous anesthesia was also reviewed. The risks                            and benefits of the procedure and the sedation                            options and risks were discussed with the patient.                            All questions were answered, and informed consent                            was obtained. Prior Anticoagulants: The patient has                            taken no previous anticoagulant or antiplatelet                            agents. ASA Grade Assessment: II - A patient with                            mild systemic disease. After reviewing the risks  and benefits, the patient was deemed in                            satisfactory condition to undergo the procedure.                           After obtaining informed consent, the endoscope was                            passed under direct vision. Throughout the                            procedure, the patient's blood pressure, pulse, and                            oxygen saturations were monitored  continuously. The                            Endoscope was introduced through the mouth, and                            advanced to the second part of duodenum. The upper                            GI endoscopy was accomplished without difficulty.                            The patient tolerated the procedure well. Scope In: Scope Out: Findings:                 The esophagus was normal.                           The stomach was normal.                           The examined duodenum was normal.                           The cardia and gastric fundus were normal on                            retroflexion. Complications:            No immediate complications. Estimated Blood Loss:     Estimated blood loss: none. Impression:               1. Normal upper endoscopy                           2. GERD. Well-controlled with current therapy                           3. No obvious abnormality of posterior pharynx                            (imaged). Recommendation:  1. Resume previous diet                           2. Continue previous medications without change                           3. Return to the care of your primary provider. If                            complaints of sore throat persist or worsen, could                            consider ENT evaluation. Your primary care provider                            can decide if this is necessary. GI follow-up as                            needed. Wilhemina Bonito. Marina Goodell, MD 08/26/2019 9:10:24 AM This report has been signed electronically.

## 2019-08-28 ENCOUNTER — Telehealth: Payer: Self-pay | Admitting: *Deleted

## 2019-08-28 NOTE — Telephone Encounter (Signed)
  Follow up Call-  Call back number 08/26/2019  Post procedure Call Back phone  # [phone]  Permission to leave phone message Yes  Some recent data might be hidden     Patient questions:  Do you have a fever, pain , or abdominal swelling? No. Pain Score  0 *  Have you tolerated food without any problems? Yes.    Have you been able to return to your normal activities? Yes.    Do you have any questions about your discharge instructions: Diet   No. Medications  No. Follow up visit  No.  Do you have questions or concerns about your Care? No.  Actions: * If pain score is 4 or above: No action needed, pain <4  1. Have you developed a fever since your procedure? no  2.   Have you had an respiratory symptoms (SOB or cough) since your procedure? no  3.   Have you tested positive for COVID 19 since your procedure no  4.   Have you had any family members/close contacts diagnosed with the COVID 19 since your procedure?  no   If yes to any of these questions please route to Joylene John, RN and Alphonsa Gin, Therapist, sports.

## 2019-09-16 ENCOUNTER — Telehealth: Payer: Self-pay | Admitting: Internal Medicine

## 2019-09-16 NOTE — Telephone Encounter (Signed)
Pt requested a refill on dicyclomine.

## 2019-09-17 MED ORDER — DICYCLOMINE HCL 20 MG PO TABS: 20.0000 mg | ORAL_TABLET | Freq: Three times a day (TID) | ORAL | 1 refills | Status: DC

## 2019-09-17 NOTE — Telephone Encounter (Signed)
Refilled Bentyl 

## 2019-10-06 DIAGNOSIS — Z23 Encounter for immunization: Secondary | ICD-10-CM | POA: Diagnosis not present

## 2019-10-15 ENCOUNTER — Other Ambulatory Visit: Payer: Self-pay | Admitting: Family Medicine

## 2019-10-22 DIAGNOSIS — Z20828 Contact with and (suspected) exposure to other viral communicable diseases: Secondary | ICD-10-CM | POA: Diagnosis not present

## 2019-11-13 ENCOUNTER — Other Ambulatory Visit: Payer: Self-pay | Admitting: Internal Medicine

## 2019-12-01 ENCOUNTER — Ambulatory Visit (INDEPENDENT_AMBULATORY_CARE_PROVIDER_SITE_OTHER): Payer: Medicaid Other | Admitting: Family Medicine

## 2019-12-01 ENCOUNTER — Encounter: Payer: Self-pay | Admitting: Family Medicine

## 2019-12-01 DIAGNOSIS — J0100 Acute maxillary sinusitis, unspecified: Secondary | ICD-10-CM

## 2019-12-01 MED ORDER — PSEUDOEPHEDRINE-GUAIFENESIN ER 120-1200 MG PO TB12: 1.0000 | ORAL_TABLET | Freq: Two times a day (BID) | ORAL | 2 refills | Status: DC

## 2019-12-01 MED ORDER — AZITHROMYCIN 250 MG PO TABS: ORAL_TABLET | ORAL | 0 refills | Status: DC

## 2019-12-01 NOTE — Progress Notes (Signed)
    Subjective:    Patient ID: Rueben Bash, female    DOB: 1976-07-08, 43 y.o.   MRN: [id number]   HPI: BELKYS HENAULT is a 43 y.o. female presenting for ears and nose clogged up. Purulent rhinorrhea.  Denies fever, cough. Dyspnea. No nausea or dairrhea.    Depression screen [facility] 2/9 06/20/2018 04/18/2018 03/18/2018 03/16/2017 08/30/2015  Decreased Interest 0 0 0 0 0  Down, Depressed, Hopeless 0 0 0 0 0  PHQ - 2 Score 0 0 0 0 0     Relevant past medical, surgical, family and social history reviewed and updated as indicated.  Interim medical history since our last visit reviewed. Allergies and medications reviewed and updated.  ROS:  Review of Systems   Social History   Tobacco Use  Smoking Status Never Smoker  Smokeless Tobacco Never Used       Objective:     Wt Readings from Last 3 Encounters:  08/26/19 209 lb (94.8 kg)  08/24/19 209 lb 8 oz (95 kg)  01/19/19 216 lb (98 kg)     Exam deferred. Pt. Harboring due to COVID 19. Phone visit performed.   Assessment & Plan:   1. Acute maxillary sinusitis, recurrence not specified     Meds ordered this encounter  Medications  . azithromycin (ZITHROMAX Z-PAK) 250 MG tablet    Sig: Take two right away Then one a day for the next 4 days.    Dispense:  6 each    Refill:  0  . Pseudoephedrine-Guaifenesin [phone] MG TB12    Sig: Take 1 tablet by mouth 2 (two) times daily. For congestion    Dispense:  12 tablet    Refill:  2    No orders of the defined types were placed in this encounter.     Diagnoses and all orders for this visit:  Acute maxillary sinusitis, recurrence not specified  Other orders -     azithromycin (ZITHROMAX Z-PAK) 250 MG tablet; Take two right away Then one a day for the next 4 days. -     Pseudoephedrine-Guaifenesin [phone] MG TB12; Take 1 tablet by mouth 2 (two) times daily. For congestion    Virtual Visit via telephone Note  I discussed the limitations, risks, security and privacy  concerns of performing an evaluation and management service by telephone and the availability of in person appointments. The patient was identified with two identifiers. Pt.expressed understanding and agreed to proceed. Pt. Is at home. Dr. Livia Snellen is in his office.  Follow Up Instructions:   I discussed the assessment and treatment plan with the patient. The patient was provided an opportunity to ask questions and all were answered. The patient agreed with the plan and demonstrated an understanding of the instructions.   The patient was advised to call back or seek an in-person evaluation if the symptoms worsen or if the condition fails to improve as anticipated.   Total minutes including chart review and phone contact time: 8   Follow up plan: Return if symptoms worsen or fail to improve.  Claretta Fraise, MD [place]

## 2019-12-08 ENCOUNTER — Ambulatory Visit (INDEPENDENT_AMBULATORY_CARE_PROVIDER_SITE_OTHER): Payer: Medicaid Other | Admitting: Family Medicine

## 2019-12-08 ENCOUNTER — Other Ambulatory Visit: Payer: Self-pay

## 2019-12-08 DIAGNOSIS — R062 Wheezing: Secondary | ICD-10-CM

## 2019-12-08 MED ORDER — ALBUTEROL SULFATE HFA 108 (90 BASE) MCG/ACT IN AERS: 2.0000 | INHALATION_SPRAY | Freq: Four times a day (QID) | RESPIRATORY_TRACT | 0 refills | Status: DC | PRN

## 2019-12-08 MED ORDER — PREDNISONE 20 MG PO TABS: 40.0000 mg | ORAL_TABLET | Freq: Every day | ORAL | 0 refills | Status: AC

## 2019-12-08 NOTE — Progress Notes (Signed)
Telephone visit  Subjective: CC: cough PCP: Mechele Claude, MD ZOX:[id number] Alyssa Arellano is a 43 y.o. female calls for telephone consult today. Patient provides verbal consent for consult held via phone.  Due to COVID-19 pandemic this visit was conducted virtually. This visit type was conducted due to national recommendations for restrictions regarding the COVID-19 Pandemic (e.g. social distancing, sheltering in place) in an effort to limit this patient's exposure and mitigate transmission in our community. All issues noted in this document were discussed and addressed.  A physical exam was not performed with this format.   Location of patient: home Location of provider: [facility] Others present for call: none  1. Wheezing Patient reports that she has been wheezing for the last couple of days.  She does report some difficulty taking a deep breath in but overall is tolerating physical activity without difficulty.  She does not have overt shortness of breath.  She does have a cough that is nonproductive.  No hemoptysis.  No fevers.  She was recently treated with a Z-Pak for sinusitis last week.  She has Mucinex for cough and congestion.  She was diagnosed with COVID-19 at the end of November.  She has not been checked out again for this to see if this is recurred.  ROS: Per HPI  Allergies  Allergen Reactions  . Penicillins    Past Medical History:  Diagnosis Date  . Anxiety   . GERD (gastroesophageal reflux disease)   . IBS (irritable bowel syndrome)   . PONV (postoperative nausea and vomiting)   . Skin disorder    possibly psoriosis, but pt is unsure. Causes red, spotchy, dry spots on body when it gets cold.    Current Outpatient Medications:  .  acetaminophen (TYLENOL) 500 MG tablet, Take 1,000 mg by mouth every 6 (six) hours as needed for moderate pain., Disp: , Rfl:  .  azithromycin (ZITHROMAX Z-PAK) 250 MG tablet, Take two right away Then one a day for the next 4 days., Disp: 6 each, Rfl:  0 .  betamethasone valerate (VALISONE) 0.1 % cream, APPLY TO AFFECTED AREA TWICE A DAY, Disp: 30 g, Rfl: 0 .  calcium carbonate (TUMS EX) 750 MG chewable tablet, Chew 1 tablet by mouth as needed for heartburn., Disp: , Rfl:  .  dicyclomine (BENTYL) 20 MG tablet, TAKE 1 TABLET (20 MG TOTAL) BY MOUTH 4 (FOUR) TIMES DAILY - BEFORE MEALS AND AT BEDTIME., Disp: 120 tablet, Rfl: 1 .  fluticasone (FLONASE) 50 MCG/ACT nasal spray, Place 2 sprays into both nostrils daily., Disp: 16 g, Rfl: 6 .  hydrochlorothiazide (HYDRODIURIL) 25 MG tablet, TAKE 1 TABLET BY MOUTH EVERY DAY, Disp: 90 tablet, Rfl: 1 .  meloxicam (MOBIC) 15 MG tablet, TAKE 1 TABLET (15 MG TOTAL) BY MOUTH DAILY. FOR JOINT AND MUSCLE PAIN, Disp: 30 tablet, Rfl: 5 .  omeprazole (PRILOSEC) 40 MG capsule, TAKE 1 CAPSULE (40 MG TOTAL) BY MOUTH 2 (TWO) TIMES DAILY. ON AN EMPTY STOMACH, Disp: 180 capsule, Rfl: 1 .  Pseudoephedrine-Guaifenesin [phone] MG TB12, Take 1 tablet by mouth 2 (two) times daily. For congestion, Disp: 12 tablet, Rfl: 2 .  sulfamethoxazole-trimethoprim (BACTRIM DS) 800-160 MG tablet, Take 1 tablet by mouth 2 (two) times daily. Until gone, for infection (Patient not taking: Reported on 08/26/2019), Disp: 20 tablet, Rfl: 0  Assessment/ Plan: 43 y.o. female   1. Wheezing We will treat empirically for bronchospasm with prednisone and albuterol.  Difficult to tell if this is reinfection with COVID-19.  We  discussed the possibility of this.  Low threshold to be evaluated emergency department if symptoms progress.  She status post treatment with Z-Pak so this should cover for any bacterial processes. - predniSONE (DELTASONE) 20 MG tablet; Take 2 tablets (40 mg total) by mouth daily with breakfast for 5 days.  Dispense: 10 tablet; Refill: 0 - albuterol (VENTOLIN HFA) 108 (90 Base) MCG/ACT inhaler; Inhale 2 puffs into the lungs every 6 (six) hours as needed for wheezing or shortness of breath.  Dispense: 18 g; Refill: 0   Start time:  4:54pm End time: 4:59pm  Total time spent on patient care (including telephone call/ virtual visit): 10 minutes  Frannie, [employer] [phone]

## 2019-12-08 NOTE — Patient Instructions (Signed)
Prevent the Spread of COVID-19 if You Are Sick If you are sick with COVID-19 or think you might have COVID-19, follow the steps below to help protect other people in your home and community. Stay home except to get medical care.  Stay home. Most people with COVID-19 have mild illness and are able to recover at home without medical care. Do not leave your home, except to get medical care. Do not visit public areas.  Take care of yourself. Get rest and stay hydrated.  Get medical care when needed. Call your doctor before you go to their office for care. But, if you have trouble breathing or other concerning symptoms, call 911 for immediate help.  Avoid public transportation, ride-sharing, or taxis. Separate yourself from other people and pets in your home.  As much as possible, stay in a specific room and away from other people and pets in your home. Also, you should use a separate bathroom, if available. If you need to be around other people or animals in or outside of the home, wear a cloth face covering. ? See COVID-19 and Animals if you have questions about pets: https://www.cdc.gov/coronavirus/2019-ncov/faq.html#COVID19animals Monitor your symptoms.  Common symptoms of COVID-19 include fever and cough. Trouble breathing is a more serious symptom that means you should get medical attention.  Follow care instructions from your healthcare provider and local health department. Your local health authorities will give instructions on checking your symptoms and reporting information. If you develop emergency warning signs for COVID-19 get medical attention immediately.  Emergency warning signs include*:  Trouble breathing  Persistent pain or pressure in the chest  New confusion or not able to be woken  Bluish lips or face *This list is not all inclusive. Please consult your medical provider for any other symptoms that are severe or concerning to you. Call 911 if you have a medical  emergency. If you have a medical emergency and need to call 911, notify the operator that you have or think you might have, COVID-19. If possible, put on a facemask before medical help arrives. Call ahead before visiting your doctor.  Call ahead. Many medical visits for routine care are being postponed or done by phone or telemedicine.  If you have a medical appointment that cannot be postponed, call your doctor's office. This will help the office protect themselves and other patients. If you are sick, wear a cloth covering over your nose and mouth.  You should wear a cloth face covering over your nose and mouth if you must be around other people or animals, including pets (even at home).  You don't need to wear the cloth face covering if you are alone. If you can't put on a cloth face covering (because of trouble breathing for example), cover your coughs and sneezes in some other way. Try to stay at least 6 feet away from other people. This will help protect the people around you. Note: During the COVID-19 pandemic, medical grade facemasks are reserved for healthcare workers and some first responders. You may need to make a cloth face covering using a scarf or bandana. Cover your coughs and sneezes.  Cover your mouth and nose with a tissue when you cough or sneeze.  Throw used tissues in a lined trash can.  Immediately wash your hands with soap and water for at least 20 seconds. If soap and water are not available, clean your hands with an alcohol-based hand sanitizer that contains at least 60% alcohol. Clean your hands often.    Wash your hands often with soap and water for at least 20 seconds. This is especially important after blowing your nose, coughing, or sneezing; going to the bathroom; and before eating or preparing food.  Use hand sanitizer if soap and water are not available. Use an alcohol-based hand sanitizer with at least 60% alcohol, covering all surfaces of your hands and rubbing  them together until they feel dry.  Soap and water are the best option, especially if your hands are visibly dirty.  Avoid touching your eyes, nose, and mouth with unwashed hands. Avoid sharing personal household items.  Do not share dishes, drinking glasses, cups, eating utensils, towels, or bedding with other people in your home.  Wash these items thoroughly after using them with soap and water or put them in the dishwasher. Clean all "high-touch" surfaces everyday.  Clean and disinfect high-touch surfaces in your "sick room" and bathroom. Let someone else clean and disinfect surfaces in common areas, but not your bedroom and bathroom.  If a caregiver or other person needs to clean and disinfect a sick person's bedroom or bathroom, they should do so on an as-needed basis. The caregiver/other person should wear a mask and wait as long as possible after the sick person has used the bathroom. High-touch surfaces include phones, remote controls, counters, tabletops, doorknobs, bathroom fixtures, toilets, keyboards, tablets, and bedside tables.  Clean and disinfect areas that may have blood, stool, or body fluids on them.  Use household cleaners and disinfectants. Clean the area or item with soap and water or another detergent if it is dirty. Then use a household disinfectant. ? Be sure to follow the instructions on the label to ensure safe and effective use of the product. Many products recommend keeping the surface wet for several minutes to ensure germs are killed. Many also recommend precautions such as wearing gloves and making sure you have good ventilation during use of the product. ? Most EPA-registered household disinfectants should be effective. How to discontinue home isolation  People with COVID-19 who have stayed home (home isolated) can stop home isolation under the following conditions: ? If you will not have a test to determine if you are still contagious, you can leave home  after these three things have happened:  You have had no fever for at least 72 hours (that is three full days of no fever without the use of medicine that reduces fevers) AND  other symptoms have improved (for example, when your cough or shortness of breath has improved) AND  at least 10 days have passed since your symptoms first appeared. ? If you will be tested to determine if you are still contagious, you can leave home after these three things have happened:  You no longer have a fever (without the use of medicine that reduces fevers) AND  other symptoms have improved (for example, when your cough or shortness of breath has improved) AND  you received two negative tests in a row, 24 hours apart. Your doctor will follow CDC guidelines. In all cases, follow the guidance of your healthcare provider and local health department. The decision to stop home isolation should be made in consultation with your healthcare provider and state and local health departments. Local decisions depend on local circumstances. cdc.gov/coronavirus 04/12/2019 This information is not intended to replace advice given to you by your health care provider. Make sure you discuss any questions you have with your health care provider. Document Released: 03/24/2019 Document Revised: 04/22/2019 Document Reviewed: 03/24/2019   Elsevier Patient Education  2020 [employer] Inc.  

## 2020-01-08 ENCOUNTER — Other Ambulatory Visit: Payer: Self-pay | Admitting: Internal Medicine

## 2020-01-15 ENCOUNTER — Other Ambulatory Visit: Payer: Self-pay | Admitting: Family Medicine

## 2020-01-23 ENCOUNTER — Other Ambulatory Visit: Payer: Self-pay | Admitting: Family Medicine

## 2020-01-23 DIAGNOSIS — I10 Essential (primary) hypertension: Secondary | ICD-10-CM

## 2020-02-05 ENCOUNTER — Other Ambulatory Visit: Payer: Self-pay | Admitting: Family Medicine

## 2020-02-10 ENCOUNTER — Other Ambulatory Visit: Payer: Self-pay | Admitting: Family Medicine

## 2020-02-23 ENCOUNTER — Other Ambulatory Visit: Payer: Self-pay

## 2020-02-23 ENCOUNTER — Encounter: Payer: Self-pay | Admitting: Family Medicine

## 2020-02-23 ENCOUNTER — Ambulatory Visit (INDEPENDENT_AMBULATORY_CARE_PROVIDER_SITE_OTHER): Payer: Medicaid Other | Admitting: Family Medicine

## 2020-02-23 VITALS — BP 132/86 | HR 73 | Temp 98.9°F | Ht 64.0 in | Wt 212.6 lb

## 2020-02-23 DIAGNOSIS — J4520 Mild intermittent asthma, uncomplicated: Secondary | ICD-10-CM | POA: Diagnosis not present

## 2020-02-23 DIAGNOSIS — I10 Essential (primary) hypertension: Secondary | ICD-10-CM

## 2020-02-23 DIAGNOSIS — K580 Irritable bowel syndrome with diarrhea: Secondary | ICD-10-CM

## 2020-02-23 DIAGNOSIS — N920 Excessive and frequent menstruation with regular cycle: Secondary | ICD-10-CM | POA: Diagnosis not present

## 2020-02-23 DIAGNOSIS — Z0000 Encounter for general adult medical examination without abnormal findings: Secondary | ICD-10-CM | POA: Diagnosis not present

## 2020-02-23 DIAGNOSIS — E559 Vitamin D deficiency, unspecified: Secondary | ICD-10-CM

## 2020-02-23 DIAGNOSIS — Z0001 Encounter for general adult medical examination with abnormal findings: Secondary | ICD-10-CM

## 2020-02-23 DIAGNOSIS — K219 Gastro-esophageal reflux disease without esophagitis: Secondary | ICD-10-CM

## 2020-02-23 MED ORDER — HYDROCHLOROTHIAZIDE 25 MG PO TABS: 25.0000 mg | ORAL_TABLET | Freq: Every day | ORAL | 0 refills | Status: DC

## 2020-02-23 MED ORDER — OMEPRAZOLE 40 MG PO CPDR: 40.0000 mg | DELAYED_RELEASE_CAPSULE | Freq: Two times a day (BID) | ORAL | 2 refills | Status: DC

## 2020-02-23 MED ORDER — ALBUTEROL SULFATE HFA 108 (90 BASE) MCG/ACT IN AERS: 2.0000 | INHALATION_SPRAY | Freq: Four times a day (QID) | RESPIRATORY_TRACT | 0 refills | Status: DC | PRN

## 2020-02-23 MED ORDER — DICYCLOMINE HCL 20 MG PO TABS: 20.0000 mg | ORAL_TABLET | Freq: Three times a day (TID) | ORAL | 1 refills | Status: DC

## 2020-02-23 MED ORDER — MELOXICAM 15 MG PO TABS: 15.0000 mg | ORAL_TABLET | Freq: Every day | ORAL | 5 refills | Status: DC

## 2020-02-23 NOTE — Addendum Note (Signed)
Addended by: Caryl Bis on: 02/23/2020 04:45 PM   Modules accepted: Orders

## 2020-02-23 NOTE — Progress Notes (Signed)
Subjective:  Patient ID: Alyssa Arellano, female    DOB: 23-Feb-1976  Age: 44 y.o. MRN: [id number]  CC: Annual Exam, Gynecologic Exam, and Heavy Menstrual Cycle   HPI Alyssa Arellano presents for Annual physical   Follow-up of hypertension. Patient has no history of headache chest pain or shortness of breath or recent cough. Patient also denies symptoms of TIA such as numbness weakness lateralizing. Patient checks  blood pressure at home and has not had any elevated readings recently. Patient denies side effects from his medication. States taking it regularly. Patient in for follow-up of GERD. Currently asymptomatic taking  PPI daily. There is no chest pain or heartburn. No hematemesis and no melena. No dysphagia or choking. Onset is remote. Progression is stable. Complicating factors, none.  Patient also takes Bentyl 4 times a day for IBS with diarrhea.  Is adequately controlled as long as she uses the Bentyl.  Additionally patient says she has had heavy flow with her menses for several years.  She had her tubes tied in her late 44s and her menstrual cycles been heavier since then.  She does not know if she really wants to have that treated or not at this time.      Depression screen [facility] 2/9 02/23/2020 06/20/2018 04/18/2018  Decreased Interest 0 0 0  Down, Depressed, Hopeless 0 0 0  PHQ - 2 Score 0 0 0    History Alyssa Arellano has a past medical history of Anxiety, GERD (gastroesophageal reflux disease), IBS (irritable bowel syndrome), PONV (postoperative nausea and vomiting), and Skin disorder.   She has a past surgical history that includes Cesarean section (2006) and Ganglion cyst excision (Right, 12/18/2013).   Her family history includes Cancer in her father.She reports that she has never smoked. She has never used smokeless tobacco. She reports that she does not drink alcohol or use drugs.    ROS Review of Systems  Constitutional: Negative for appetite change, chills, diaphoresis,  fatigue, fever and unexpected weight change.  HENT: Negative for congestion, ear pain, hearing loss, postnasal drip, rhinorrhea, sneezing, sore throat and trouble swallowing.   Eyes: Negative for pain.  Respiratory: Negative for cough, chest tightness and shortness of breath.   Cardiovascular: Negative for chest pain and palpitations.  Gastrointestinal: Negative for abdominal pain, constipation, diarrhea, nausea and vomiting.  Endocrine: Negative for cold intolerance, heat intolerance, polydipsia, polyphagia and polyuria.  Genitourinary: Positive for menstrual problem (menorrhagia). Negative for dysuria and frequency.  Musculoskeletal: Negative for arthralgias and joint swelling.  Skin: Negative for rash.  Allergic/Immunologic: Negative for environmental allergies.  Neurological: Negative for dizziness, weakness, numbness and headaches.  Psychiatric/Behavioral: Negative for agitation and dysphoric mood.    Objective:  BP 132/86   Pulse 73   Temp 98.9 F (37.2 C) (Temporal)   Ht _0  (1.626 m)   Wt 212 lb 9.6 oz (96.4 kg)   BMI 36.49 kg/m   BP Readings from Last 3 Encounters:  02/23/20 132/86  08/26/19 139/89  08/24/19 128/90    Wt Readings from Last 3 Encounters:  02/23/20 212 lb 9.6 oz (96.4 kg)  08/26/19 209 lb (94.8 kg)  08/24/19 209 lb 8 oz (95 kg)     Physical Exam Exam conducted with a chaperone present.  Constitutional:      General: She is not in acute distress.    Appearance: Normal appearance. She is well-developed.  HENT:     Head: Normocephalic and atraumatic.     Right Ear: External ear  normal.     Left Ear: External ear normal.     Nose: Nose normal.  Eyes:     Conjunctiva/sclera: Conjunctivae normal.     Pupils: Pupils are equal, round, and reactive to light.  Neck:     Thyroid: No thyromegaly.  Cardiovascular:     Rate and Rhythm: Normal rate and regular rhythm.     Heart sounds: Normal heart sounds. No murmur.  Pulmonary:     Effort:  Pulmonary effort is normal. No respiratory distress.     Breath sounds: Normal breath sounds. No wheezing or rales.  Chest:     Breasts: Breasts are symmetrical.        Right: No inverted nipple, mass or tenderness.        Left: No inverted nipple, mass or tenderness.  Abdominal:     General: Bowel sounds are normal. There is no distension or abdominal bruit.     Palpations: Abdomen is soft. There is no hepatomegaly, splenomegaly or mass.     Tenderness: There is no abdominal tenderness. Negative signs include Murphy's sign and McBurney's sign.  Genitourinary:    General: Normal vulva.     Exam position: Lithotomy position.     Labia:        Right: No tenderness or lesion.        Left: No tenderness or lesion.      Vagina: Normal. No erythema, tenderness, bleeding, lesions or prolapsed vaginal walls.     Cervix: No cervical motion tenderness, discharge, friability, lesion, erythema or cervical bleeding.  Musculoskeletal:        General: No tenderness. Normal range of motion.     Cervical back: Normal range of motion and neck supple.  Lymphadenopathy:     Cervical: No cervical adenopathy.  Skin:    General: Skin is warm and dry.     Findings: No rash.  Neurological:     Mental Status: She is alert and oriented to person, place, and time.     Deep Tendon Reflexes: Reflexes are normal and symmetric.  Psychiatric:        Behavior: Behavior normal.        Thought Content: Thought content normal.        Judgment: Judgment normal.       Assessment & Plan:   Alyssa Arellano was seen today for annual exam, gynecologic exam and heavy menstrual cycle.  Diagnoses and all orders for this visit:  Well adult exam -     CBC with Differential/Platelet -     CMP14+EGFR -     Lipid panel -     TSH -     Urinalysis  Morbid obesity ([place]) -     CBC with Differential/Platelet -     CMP14+EGFR  Menorrhagia with regular cycle -     CBC with Differential/Platelet -     CMP14+EGFR -      TSH -     US PELVIS (TRANSABDOMINAL ONLY); Future -     meloxicam (MOBIC) 15 MG tablet; Take 1 tablet (15 mg total) by mouth daily. For joint and muscle pain  Irritable bowel syndrome with diarrhea -     CBC with Differential/Platelet -     CMP14+EGFR -     Urinalysis -     dicyclomine (BENTYL) 20 MG tablet; Take 1 tablet (20 mg total) by mouth 4 (four) times daily -  before meals and at bedtime.  Essential hypertension -     CBC with  Differential/Platelet -     CMP14+EGFR -     Lipid panel -     Urinalysis -     hydrochlorothiazide (HYDRODIURIL) 25 MG tablet; Take 1 tablet (25 mg total) by mouth daily. (Needs to be seen before next refill)  Gastroesophageal reflux disease without esophagitis -     CBC with Differential/Platelet -     CMP14+EGFR -     omeprazole (PRILOSEC) 40 MG capsule; Take 1 capsule (40 mg total) by mouth 2 (two) times daily. On an empty stomach  Mild intermittent asthma, unspecified whether complicated -     CBC with Differential/Platelet -     CMP14+EGFR -     albuterol (VENTOLIN HFA) 108 (90 Base) MCG/ACT inhaler; Inhale 2 puffs into the lungs every 6 (six) hours as needed for wheezing or shortness of breath.  Vitamin D deficiency -     VITAMIN D 25 Hydroxy (Vit-D Deficiency, Fractures)       I have discontinued Mechel G. Hockey's Pseudoephedrine-Guaifenesin. I have also changed her dicyclomine. Additionally, I am having her maintain her acetaminophen, calcium carbonate, betamethasone valerate, fluticasone, albuterol, hydrochlorothiazide, meloxicam, and omeprazole.  Allergies as of 02/23/2020      Reactions   Penicillins       Medication List       Accurate as of February 23, 2020  2:46 PM. If you have any questions, ask your nurse or doctor.        STOP taking these medications   Pseudoephedrine-Guaifenesin [phone] MG Tb12 Stopped by: Claretta Fraise, MD     TAKE these medications   acetaminophen 500 MG tablet Commonly known as:  TYLENOL Take 1,000 mg by mouth every 6 (six) hours as needed for moderate pain.   albuterol 108 (90 Base) MCG/ACT inhaler Commonly known as: VENTOLIN HFA Inhale 2 puffs into the lungs every 6 (six) hours as needed for wheezing or shortness of breath.   betamethasone valerate 0.1 % cream Commonly known as: VALISONE APPLY TO AFFECTED AREA TWICE A DAY   calcium carbonate 750 MG chewable tablet Commonly known as: TUMS EX Chew 1 tablet by mouth as needed for heartburn.   dicyclomine 20 MG tablet Commonly known as: BENTYL Take 1 tablet (20 mg total) by mouth 4 (four) times daily -  before meals and at bedtime.   fluticasone 50 MCG/ACT nasal spray Commonly known as: FLONASE SPRAY 2 SPRAYS INTO EACH NOSTRIL EVERY DAY   hydrochlorothiazide 25 MG tablet Commonly known as: HYDRODIURIL Take 1 tablet (25 mg total) by mouth daily. (Needs to be seen before next refill)   meloxicam 15 MG tablet Commonly known as: MOBIC Take 1 tablet (15 mg total) by mouth daily. For joint and muscle pain   omeprazole 40 MG capsule Commonly known as: PRILOSEC Take 1 capsule (40 mg total) by mouth 2 (two) times daily. On an empty stomach        Follow-up: No follow-ups on file.  Claretta Fraise, M.D.

## 2020-02-24 ENCOUNTER — Other Ambulatory Visit: Payer: Medicaid Other

## 2020-02-24 ENCOUNTER — Other Ambulatory Visit: Payer: Self-pay | Admitting: Family Medicine

## 2020-02-24 DIAGNOSIS — K580 Irritable bowel syndrome with diarrhea: Secondary | ICD-10-CM | POA: Diagnosis not present

## 2020-02-24 DIAGNOSIS — K219 Gastro-esophageal reflux disease without esophagitis: Secondary | ICD-10-CM | POA: Diagnosis not present

## 2020-02-24 DIAGNOSIS — I10 Essential (primary) hypertension: Secondary | ICD-10-CM | POA: Diagnosis not present

## 2020-02-24 DIAGNOSIS — Z0000 Encounter for general adult medical examination without abnormal findings: Secondary | ICD-10-CM | POA: Diagnosis not present

## 2020-02-24 DIAGNOSIS — J4520 Mild intermittent asthma, uncomplicated: Secondary | ICD-10-CM | POA: Diagnosis not present

## 2020-02-24 DIAGNOSIS — E559 Vitamin D deficiency, unspecified: Secondary | ICD-10-CM | POA: Diagnosis not present

## 2020-02-24 DIAGNOSIS — N920 Excessive and frequent menstruation with regular cycle: Secondary | ICD-10-CM | POA: Diagnosis not present

## 2020-02-24 LAB — URINALYSIS
Bilirubin, UA: NEGATIVE
Glucose, UA: NEGATIVE
Ketones, UA: NEGATIVE
Leukocytes,UA: NEGATIVE
Nitrite, UA: NEGATIVE
Protein,UA: NEGATIVE
Specific Gravity, UA: 1.01 (ref 1.005–1.030)
Urobilinogen, Ur: 0.2 mg/dL (ref 0.2–1.0)
pH, UA: 6 (ref 5.0–7.5)

## 2020-02-25 ENCOUNTER — Other Ambulatory Visit: Payer: Self-pay | Admitting: Family Medicine

## 2020-02-25 LAB — CMP14+EGFR
ALT: 11 IU/L (ref 0–32)
AST: 16 IU/L (ref 0–40)
Albumin/Globulin Ratio: 1.4 (ref 1.2–2.2)
Albumin: 3.9 g/dL (ref 3.8–4.8)
Alkaline Phosphatase: 66 IU/L (ref 39–117)
BUN/Creatinine Ratio: 12 (ref 9–23)
BUN: 11 mg/dL (ref 6–24)
Bilirubin Total: 0.2 mg/dL (ref 0.0–1.2)
CO2: 26 mmol/L (ref 20–29)
Calcium: 8.9 mg/dL (ref 8.7–10.2)
Chloride: 99 mmol/L (ref 96–106)
Creatinine, Ser: 0.95 mg/dL (ref 0.57–1.00)
GFR calc Af Amer: 85 mL/min/{1.73_m2} (ref >59)
GFR calc non Af Amer: 74 mL/min/{1.73_m2} (ref >59)
Globulin, Total: 2.7 g/dL (ref 1.5–4.5)
Glucose: 113 mg/dL — ABNORMAL HIGH (ref 65–99)
Potassium: 3.2 mmol/L — ABNORMAL LOW (ref 3.5–5.2)
Sodium: 139 mmol/L (ref 134–144)
Total Protein: 6.6 g/dL (ref 6.0–8.5)

## 2020-02-25 LAB — LIPID PANEL
Chol/HDL Ratio: 3.4 ratio (ref 0.0–4.4)
Cholesterol, Total: 155 mg/dL (ref 100–199)
HDL: 45 mg/dL (ref >39)
LDL Chol Calc (NIH): 89 mg/dL (ref 0–99)
Triglycerides: 118 mg/dL (ref 0–149)
VLDL Cholesterol Cal: 21 mg/dL (ref 5–40)

## 2020-02-25 LAB — CBC WITH DIFFERENTIAL/PLATELET
Basophils Absolute: 0.1 10*3/uL (ref 0.0–0.2)
Basos: 1 %
EOS (ABSOLUTE): 0.4 10*3/uL (ref 0.0–0.4)
Eos: 5 %
Hematocrit: 36.4 % (ref 34.0–46.6)
Hemoglobin: 11.5 g/dL (ref 11.1–15.9)
Immature Grans (Abs): 0 10*3/uL (ref 0.0–0.1)
Immature Granulocytes: 0 %
Lymphocytes Absolute: 2 10*3/uL (ref 0.7–3.1)
Lymphs: 28 %
MCH: 24.1 pg — ABNORMAL LOW (ref 26.6–33.0)
MCHC: 31.6 g/dL (ref 31.5–35.7)
MCV: 76 fL — ABNORMAL LOW (ref 79–97)
Monocytes Absolute: 0.6 10*3/uL (ref 0.1–0.9)
Monocytes: 8 %
Neutrophils Absolute: 4.3 10*3/uL (ref 1.4–7.0)
Neutrophils: 58 %
Platelets: 289 10*3/uL (ref 150–450)
RBC: 4.77 x10E6/uL (ref 3.77–5.28)
RDW: 15.1 % (ref 11.7–15.4)
WBC: 7.3 10*3/uL (ref 3.4–10.8)

## 2020-02-25 LAB — VITAMIN D 25 HYDROXY (VIT D DEFICIENCY, FRACTURES): Vit D, 25-Hydroxy: 23.2 ng/mL — ABNORMAL LOW (ref 30.0–100.0)

## 2020-02-25 LAB — TSH: TSH: 2.2 u[IU]/mL (ref 0.450–4.500)

## 2020-02-25 MED ORDER — VITAMIN D (ERGOCALCIFEROL) 1.25 MG (50000 UNIT) PO CAPS: 50000.0000 [IU] | ORAL_CAPSULE | ORAL | 3 refills | Status: DC

## 2020-02-25 NOTE — Progress Notes (Signed)
Vitamin D is low.  Prescription strength supplement weekly recommended.  Prescription sent.  She also needs iron, TIBC, ferritin due to low indices and borderline hemoglobin for anemia.  Please ask lab to add these tests.

## 2020-02-26 LAB — IRON AND TIBC
Iron Saturation: 7 % — CL (ref 15–55)
Iron: 25 ug/dL — ABNORMAL LOW (ref 27–159)
Total Iron Binding Capacity: 378 ug/dL (ref 250–450)
UIBC: 353 ug/dL (ref 131–425)

## 2020-02-26 LAB — FERRITIN: Ferritin: 7 ng/mL — ABNORMAL LOW (ref 15–150)

## 2020-02-26 LAB — SPECIMEN STATUS REPORT

## 2020-02-29 ENCOUNTER — Other Ambulatory Visit: Payer: Self-pay | Admitting: *Deleted

## 2020-02-29 DIAGNOSIS — D509 Iron deficiency anemia, unspecified: Secondary | ICD-10-CM

## 2020-02-29 LAB — IGP, APTIMA HPV: HPV Aptima: NEGATIVE

## 2020-02-29 NOTE — Progress Notes (Signed)
Your recent Pap smear performed in the office came back normal.

## 2020-03-02 ENCOUNTER — Other Ambulatory Visit: Payer: Self-pay

## 2020-03-02 ENCOUNTER — Ambulatory Visit (HOSPITAL_COMMUNITY)
Admission: RE | Admit: 2020-03-02 | Discharge: 2020-03-02 | Disposition: A | Discharge status: Home / Self Care | Payer: Medicaid Other | Source: Ambulatory Visit | Attending: Family Medicine | Admitting: Family Medicine

## 2020-03-02 DIAGNOSIS — N920 Excessive and frequent menstruation with regular cycle: Secondary | ICD-10-CM | POA: Diagnosis not present

## 2020-03-21 ENCOUNTER — Encounter: Payer: Medicaid Other | Admitting: Physician Assistant

## 2020-04-09 ENCOUNTER — Other Ambulatory Visit: Payer: Self-pay | Admitting: Family Medicine

## 2020-04-09 DIAGNOSIS — I10 Essential (primary) hypertension: Secondary | ICD-10-CM

## 2020-05-05 ENCOUNTER — Other Ambulatory Visit: Payer: Self-pay | Admitting: Family Medicine

## 2020-05-05 DIAGNOSIS — K580 Irritable bowel syndrome with diarrhea: Secondary | ICD-10-CM

## 2020-05-12 ENCOUNTER — Other Ambulatory Visit: Payer: Self-pay | Admitting: Family Medicine

## 2020-07-07 ENCOUNTER — Other Ambulatory Visit: Payer: Self-pay | Admitting: Family Medicine

## 2020-07-07 DIAGNOSIS — K219 Gastro-esophageal reflux disease without esophagitis: Secondary | ICD-10-CM

## 2020-07-07 DIAGNOSIS — K580 Irritable bowel syndrome with diarrhea: Secondary | ICD-10-CM

## 2020-08-04 ENCOUNTER — Ambulatory Visit (INDEPENDENT_AMBULATORY_CARE_PROVIDER_SITE_OTHER): Payer: Medicaid Other | Admitting: Nurse Practitioner

## 2020-08-04 DIAGNOSIS — B3731 Acute candidiasis of vulva and vagina: Secondary | ICD-10-CM

## 2020-08-04 DIAGNOSIS — B373 Candidiasis of vulva and vagina: Secondary | ICD-10-CM

## 2020-08-04 MED ORDER — FLUCONAZOLE 150 MG PO TABS: 150.0000 mg | ORAL_TABLET | Freq: Once | ORAL | 0 refills | Status: AC

## 2020-08-04 NOTE — Progress Notes (Signed)
° °  Virtual Visit via telephone Note Due to COVID-19 pandemic this visit was conducted virtually. This visit type was conducted due to national recommendations for restrictions regarding the COVID-19 Pandemic (e.g. social distancing, sheltering in place) in an effort to limit this patient's exposure and mitigate transmission in our community. All issues noted in this document were discussed and addressed.  A physical exam was not performed with this format.  I connected with Alyssa Arellano on 08/04/20 at 12:15 by telephone and verified that I am speaking with the correct person using two identifiers. Alyssa Arellano is currently located at home and no one is currently with her during visit. The provider, Mary-Margaret Daphine Deutscher, FNP is located in their office at time of visit.  I discussed the limitations, risks, security and privacy concerns of performing an evaluation and management service by telephone and the availability of in person appointments. I also discussed with the patient that there may be a patient responsible charge related to this service. The patient expressed understanding and agreed to proceed.   History and Present Illness:   Chief Complaint: Urinary Tract Infection   HPI Patient calls in c/o periueum burnig when she voids. Area is very itchy. She deneis urinary freq and urgency.  Denies any suprapubic pain or back pain.   Review of Systems  Constitutional: Negative for diaphoresis and weight loss.  Eyes: Negative for blurred vision, double vision and pain.  Respiratory: Negative for shortness of breath.   Cardiovascular: Negative for chest pain, palpitations, orthopnea and leg swelling.  Gastrointestinal: Negative for abdominal pain.  Skin: Negative for rash.  Neurological: Negative for dizziness, sensory change, loss of consciousness, weakness and headaches.  Endo/Heme/Allergies: Negative for polydipsia. Does not bruise/bleed easily.  Psychiatric/Behavioral: Negative  for memory loss. The patient does not have insomnia.   All other systems reviewed and are negative.    Observations/Objective: Alert and oriented- answers all questions appropriately No distress    Assessment and Plan: Alyssa Arellano in today with chief complaint of Urinary Tract Infection   1. Vaginal candida Monistat OTC topically Avoid bubble baths - fluconazole (DIFLUCAN) 150 MG tablet; Take 1 tablet (150 mg total) by mouth once for 1 dose.  Dispense: 1 tablet; Refill: 0    Follow Up Instructions: prn    I discussed the assessment and treatment plan with the patient. The patient was provided an opportunity to ask questions and all were answered. The patient agreed with the plan and demonstrated an understanding of the instructions.   The patient was advised to call back or seek an in-person evaluation if the symptoms worsen or if the condition fails to improve as anticipated.  The above assessment and management plan was discussed with the patient. The patient verbalized understanding of and has agreed to the management plan. Patient is aware to call the clinic if symptoms persist or worsen. Patient is aware when to return to the clinic for a follow-up visit. Patient educated on when it is appropriate to go to the emergency department.   Time call ended:  12:30  I provided 15 minutes of non-face-to-face time during this encounter.    Mary-Margaret Daphine Deutscher, FNP

## 2020-09-06 ENCOUNTER — Encounter: Payer: Self-pay | Admitting: Family Medicine

## 2020-09-06 ENCOUNTER — Other Ambulatory Visit: Payer: Self-pay

## 2020-09-06 ENCOUNTER — Ambulatory Visit: Payer: Medicaid Other | Admitting: Family Medicine

## 2020-09-06 VITALS — BP 126/83 | HR 54 | Temp 98.3°F | Resp 20 | Ht 64.0 in | Wt 210.0 lb

## 2020-09-06 DIAGNOSIS — N946 Dysmenorrhea, unspecified: Secondary | ICD-10-CM | POA: Diagnosis not present

## 2020-09-06 DIAGNOSIS — K589 Irritable bowel syndrome without diarrhea: Secondary | ICD-10-CM

## 2020-09-06 DIAGNOSIS — R3589 Other polyuria: Secondary | ICD-10-CM

## 2020-09-06 DIAGNOSIS — N3941 Urge incontinence: Secondary | ICD-10-CM | POA: Diagnosis not present

## 2020-09-06 DIAGNOSIS — N920 Excessive and frequent menstruation with regular cycle: Secondary | ICD-10-CM

## 2020-09-06 DIAGNOSIS — I10 Essential (primary) hypertension: Secondary | ICD-10-CM | POA: Diagnosis not present

## 2020-09-06 DIAGNOSIS — R358 Other polyuria: Secondary | ICD-10-CM | POA: Diagnosis not present

## 2020-09-06 DIAGNOSIS — K219 Gastro-esophageal reflux disease without esophagitis: Secondary | ICD-10-CM | POA: Diagnosis not present

## 2020-09-06 DIAGNOSIS — K580 Irritable bowel syndrome with diarrhea: Secondary | ICD-10-CM

## 2020-09-06 DIAGNOSIS — E559 Vitamin D deficiency, unspecified: Secondary | ICD-10-CM

## 2020-09-06 LAB — URINALYSIS, COMPLETE
Bilirubin, UA: NEGATIVE
Glucose, UA: NEGATIVE
Ketones, UA: NEGATIVE
Leukocytes,UA: NEGATIVE
Nitrite, UA: NEGATIVE
Protein,UA: NEGATIVE
Specific Gravity, UA: 1.015 (ref 1.005–1.030)
Urobilinogen, Ur: 0.2 mg/dL (ref 0.2–1.0)
pH, UA: 7.5 (ref 5.0–7.5)

## 2020-09-06 LAB — MICROSCOPIC EXAMINATION
Bacteria, UA: NONE SEEN
RBC, Urine: NONE SEEN /hpf (ref 0–2)
WBC, UA: NONE SEEN /hpf (ref 0–5)

## 2020-09-06 MED ORDER — DICYCLOMINE HCL 20 MG PO TABS: 20.0000 mg | ORAL_TABLET | Freq: Three times a day (TID) | ORAL | 2 refills | Status: DC

## 2020-09-06 MED ORDER — MELOXICAM 15 MG PO TABS: 15.0000 mg | ORAL_TABLET | Freq: Every day | ORAL | 5 refills | Status: DC

## 2020-09-06 MED ORDER — HYDROCHLOROTHIAZIDE 25 MG PO TABS: 25.0000 mg | ORAL_TABLET | Freq: Every day | ORAL | 5 refills | Status: DC

## 2020-09-06 MED ORDER — OMEPRAZOLE 40 MG PO CPDR: 40.0000 mg | DELAYED_RELEASE_CAPSULE | Freq: Two times a day (BID) | ORAL | 2 refills | Status: DC

## 2020-09-06 MED ORDER — NORGESTREL-ETHINYL ESTRADIOL 0.3-30 MG-MCG PO TABS: 1.0000 | ORAL_TABLET | Freq: Every day | ORAL | 11 refills | Status: DC

## 2020-09-06 MED ORDER — SOLIFENACIN SUCCINATE 10 MG PO TABS: 10.0000 mg | ORAL_TABLET | Freq: Every day | ORAL | 5 refills | Status: DC

## 2020-09-06 NOTE — Progress Notes (Signed)
Subjective:  Patient ID: Alyssa Arellano, female    DOB: 1976-05-20  Age: 44 y.o. MRN: [id number]  CC: Medical Management of Chronic Issues   HPI KIMBLEY SPRAGUE presents for  follow-up of hypertension. Patient has no history of headache chest pain or shortness of breath or recent cough. Patient also denies symptoms of TIA such as focal numbness or weakness. Patient denies side effects from medication. States taking it regularly.  Patient in for follow-up of GERD. Currently asymptomatic taking  PPI daily. There is no chest pain or heartburn. No hematemesis and no melena. No dysphagia or choking. Onset is remote. Progression is stable. Complicating factors, none.  Follow  up of asthma  Patient is having a great deal of dysmenorrhea with menorrhagia.  She would like to try birth control to bring this under control.  Patient had's Pap smear performed March of this year.  Patient is also having some trouble with urinary incontinence.  She has urge incontinence as well as stress incontinence.  She denies any frequency of urination.  History [place] has a past medical history of Anxiety, GERD (gastroesophageal reflux disease), IBS (irritable bowel syndrome), PONV (postoperative nausea and vomiting), and Skin disorder.   She has a past surgical history that includes Cesarean section (2006) and Ganglion cyst excision (Right, 12/18/2013).   Her family history includes Cancer in her father.She reports that she has never smoked. She has never used smokeless tobacco. She reports that she does not drink alcohol and does not use drugs.  Current Outpatient Medications on File Prior to Visit  Medication Sig Dispense Refill  . acetaminophen (TYLENOL) 500 MG tablet Take 1,000 mg by mouth every 6 (six) hours as needed for moderate pain.    Marland Kitchen betamethasone valerate (VALISONE) 0.1 % cream APPLY TO AFFECTED AREA TWICE A DAY 30 g 0  . calcium carbonate (TUMS EX) 750 MG chewable tablet Chew 1 tablet by mouth as  needed for heartburn.    . ferrous sulfate 325 (65 FE) MG EC tablet Take 325 mg by mouth in the morning and at bedtime.    . fluticasone (FLONASE) 50 MCG/ACT nasal spray SPRAY 2 SPRAYS INTO EACH NOSTRIL EVERY DAY 16 mL 6  . Vitamin D, Ergocalciferol, (DRISDOL) 1.25 MG (50000 UNIT) CAPS capsule Take 1 capsule (50,000 Units total) by mouth every 7 (seven) days. 13 capsule 3   No current facility-administered medications on file prior to visit.    ROS Review of Systems  Constitutional: Negative.   HENT: Negative for congestion.   Eyes: Negative for visual disturbance.  Respiratory: Negative for shortness of breath.   Cardiovascular: Negative for chest pain.  Gastrointestinal: Negative for abdominal pain, constipation, diarrhea, nausea and vomiting.  Genitourinary: Positive for enuresis, menstrual problem, pelvic pain and vaginal bleeding. Negative for difficulty urinating.  Musculoskeletal: Negative for arthralgias and myalgias.  Neurological: Negative for headaches.  Psychiatric/Behavioral: Negative for sleep disturbance.    Objective:  BP 126/83   Pulse (!) 54   Temp 98.3 F (36.8 C) (Temporal)   Resp 20   Ht '5\' 4"'  (1.626 m)   Wt 210 lb (95.3 kg)   SpO2 100%   BMI 36.05 kg/m   BP Readings from Last 3 Encounters:  09/06/20 126/83  02/23/20 132/86  08/26/19 139/89    Wt Readings from Last 3 Encounters:  09/06/20 210 lb (95.3 kg)  02/23/20 212 lb 9.6 oz (96.4 kg)  08/26/19 209 lb (94.8 kg)     Physical Exam Constitutional:  General: She is not in acute distress.    Appearance: She is well-developed.  HENT:     Head: Normocephalic and atraumatic.  Eyes:     Conjunctiva/sclera: Conjunctivae normal.     Pupils: Pupils are equal, round, and reactive to light.  Neck:     Thyroid: No thyromegaly.  Cardiovascular:     Rate and Rhythm: Normal rate and regular rhythm.     Heart sounds: Normal heart sounds. No murmur heard.   Pulmonary:     Effort: Pulmonary  effort is normal. No respiratory distress.     Breath sounds: Normal breath sounds. No wheezing or rales.  Abdominal:     General: Bowel sounds are normal. There is no distension.     Palpations: Abdomen is soft.     Tenderness: There is no abdominal tenderness.  Musculoskeletal:        General: Normal range of motion.     Cervical back: Normal range of motion and neck supple.  Lymphadenopathy:     Cervical: No cervical adenopathy.  Skin:    General: Skin is warm and dry.  Neurological:     Mental Status: She is alert and oriented to person, place, and time.  Psychiatric:        Behavior: Behavior normal.        Thought Content: Thought content normal.        Judgment: Judgment normal.       Assessment & Plan:   Chimere was seen today for medical management of chronic issues.  Diagnoses and all orders for this visit:  Polyuria -     Urinalysis, Complete -     Urine Culture -     CBC with Differential/Platelet -     CMP14+EGFR  Gastroesophageal reflux disease without esophagitis -     omeprazole (PRILOSEC) 40 MG capsule; Take 1 capsule (40 mg total) by mouth 2 (two) times daily. On an empty stomach -     CBC with Differential/Platelet -     CMP14+EGFR  Essential hypertension -     hydrochlorothiazide (HYDRODIURIL) 25 MG tablet; Take 1 tablet (25 mg total) by mouth daily. -     CBC with Differential/Platelet -     CMP14+EGFR  Irritable bowel syndrome, unspecified type -     CBC with Differential/Platelet -     CMP14+EGFR  Urge incontinence of urine -     CBC with Differential/Platelet -     CMP14+EGFR  Dysmenorrhea -     CBC with Differential/Platelet -     CMP14+EGFR  Irritable bowel syndrome with diarrhea -     dicyclomine (BENTYL) 20 MG tablet; Take 1 tablet (20 mg total) by mouth 4 (four) times daily -  before meals and at bedtime. -     CBC with Differential/Platelet -     CMP14+EGFR  Menorrhagia with regular cycle -     meloxicam (MOBIC) 15 MG  tablet; Take 1 tablet (15 mg total) by mouth daily. For joint and muscle pain -     CBC with Differential/Platelet -     CMP14+EGFR  Vitamin D deficiency -     CMP14+EGFR -     VITAMIN D 25 Hydroxy (Vit-D Deficiency, Fractures)  Other orders -     solifenacin (VESICARE) 10 MG tablet; Take 1 tablet (10 mg total) by mouth daily. For bladder control -     norgestrel-ethinyl estradiol (LO/OVRAL) 0.3-30 MG-MCG tablet; Take 1 tablet by mouth daily. -  Microscopic Examination   Allergies as of 09/06/2020      Reactions   Penicillins       Medication List       Accurate as of September 06, 2020  9:59 PM. If you have any questions, ask your nurse or doctor.        STOP taking these medications   albuterol 108 (90 Base) MCG/ACT inhaler Commonly known as: VENTOLIN HFA Stopped by: Claretta Fraise, MD     TAKE these medications   acetaminophen 500 MG tablet Commonly known as: TYLENOL Take 1,000 mg by mouth every 6 (six) hours as needed for moderate pain.   betamethasone valerate 0.1 % cream Commonly known as: VALISONE APPLY TO AFFECTED AREA TWICE A DAY   calcium carbonate 750 MG chewable tablet Commonly known as: TUMS EX Chew 1 tablet by mouth as needed for heartburn.   dicyclomine 20 MG tablet Commonly known as: BENTYL Take 1 tablet (20 mg total) by mouth 4 (four) times daily -  before meals and at bedtime.   ferrous sulfate 325 (65 FE) MG EC tablet Take 325 mg by mouth in the morning and at bedtime.   fluticasone 50 MCG/ACT nasal spray Commonly known as: FLONASE SPRAY 2 SPRAYS INTO EACH NOSTRIL EVERY DAY   hydrochlorothiazide 25 MG tablet Commonly known as: HYDRODIURIL Take 1 tablet (25 mg total) by mouth daily.   meloxicam 15 MG tablet Commonly known as: MOBIC Take 1 tablet (15 mg total) by mouth daily. For joint and muscle pain   norgestrel-ethinyl estradiol 0.3-30 MG-MCG tablet Commonly known as: LO/OVRAL Take 1 tablet by mouth daily. Started by: Claretta Fraise, MD   omeprazole 40 MG capsule Commonly known as: PRILOSEC Take 1 capsule (40 mg total) by mouth 2 (two) times daily. On an empty stomach   solifenacin 10 MG tablet Commonly known as: VESIcare Take 1 tablet (10 mg total) by mouth daily. For bladder control Started by: Claretta Fraise, MD   Vitamin D (Ergocalciferol) 1.25 MG (50000 UNIT) Caps capsule Commonly known as: DRISDOL Take 1 capsule (50,000 Units total) by mouth every 7 (seven) days.       Meds ordered this encounter  Medications  . solifenacin (VESICARE) 10 MG tablet    Sig: Take 1 tablet (10 mg total) by mouth daily. For bladder control    Dispense:  30 tablet    Refill:  5  . norgestrel-ethinyl estradiol (LO/OVRAL) 0.3-30 MG-MCG tablet    Sig: Take 1 tablet by mouth daily.    Dispense:  28 tablet    Refill:  11  . dicyclomine (BENTYL) 20 MG tablet    Sig: Take 1 tablet (20 mg total) by mouth 4 (four) times daily -  before meals and at bedtime.    Dispense:  120 tablet    Refill:  2  . hydrochlorothiazide (HYDRODIURIL) 25 MG tablet    Sig: Take 1 tablet (25 mg total) by mouth daily.    Dispense:  30 tablet    Refill:  5    DX Code Needed  .  . meloxicam (MOBIC) 15 MG tablet    Sig: Take 1 tablet (15 mg total) by mouth daily. For joint and muscle pain    Dispense:  30 tablet    Refill:  5  . omeprazole (PRILOSEC) 40 MG capsule    Sig: Take 1 capsule (40 mg total) by mouth 2 (two) times daily. On an empty stomach    Dispense:  60 capsule  Refill:  2      Follow-up: Return in about 3 months (around 12/06/2020).  Claretta Fraise, M.D.

## 2020-09-07 LAB — CMP14+EGFR
ALT: 13 IU/L (ref 0–32)
AST: 11 IU/L (ref 0–40)
Albumin/Globulin Ratio: 1.5 (ref 1.2–2.2)
Albumin: 4.2 g/dL (ref 3.8–4.8)
Alkaline Phosphatase: 67 IU/L (ref 44–121)
BUN/Creatinine Ratio: 18 (ref 9–23)
BUN: 14 mg/dL (ref 6–24)
Bilirubin Total: 0.4 mg/dL (ref 0.0–1.2)
CO2: 22 mmol/L (ref 20–29)
Calcium: 9 mg/dL (ref 8.7–10.2)
Chloride: 101 mmol/L (ref 96–106)
Creatinine, Ser: 0.8 mg/dL (ref 0.57–1.00)
GFR calc Af Amer: 105 mL/min/{1.73_m2} (ref >59)
GFR calc non Af Amer: 91 mL/min/{1.73_m2} (ref >59)
Globulin, Total: 2.8 g/dL (ref 1.5–4.5)
Glucose: 108 mg/dL — ABNORMAL HIGH (ref 65–99)
Potassium: 3.5 mmol/L (ref 3.5–5.2)
Sodium: 141 mmol/L (ref 134–144)
Total Protein: 7 g/dL (ref 6.0–8.5)

## 2020-09-07 LAB — CBC WITH DIFFERENTIAL/PLATELET
Basophils Absolute: 0 10*3/uL (ref 0.0–0.2)
Basos: 1 %
EOS (ABSOLUTE): 0.2 10*3/uL (ref 0.0–0.4)
Eos: 3 %
Hematocrit: 43.3 % (ref 34.0–46.6)
Hemoglobin: 14.4 g/dL (ref 11.1–15.9)
Immature Grans (Abs): 0 10*3/uL (ref 0.0–0.1)
Immature Granulocytes: 0 %
Lymphocytes Absolute: 1.8 10*3/uL (ref 0.7–3.1)
Lymphs: 23 %
MCH: 28.9 pg (ref 26.6–33.0)
MCHC: 33.3 g/dL (ref 31.5–35.7)
MCV: 87 fL (ref 79–97)
Monocytes Absolute: 0.6 10*3/uL (ref 0.1–0.9)
Monocytes: 7 %
Neutrophils Absolute: 5.2 10*3/uL (ref 1.4–7.0)
Neutrophils: 66 %
Platelets: 237 10*3/uL (ref 150–450)
RBC: 4.99 x10E6/uL (ref 3.77–5.28)
RDW: 13.9 % (ref 11.7–15.4)
WBC: 7.9 10*3/uL (ref 3.4–10.8)

## 2020-09-07 LAB — VITAMIN D 25 HYDROXY (VIT D DEFICIENCY, FRACTURES): Vit D, 25-Hydroxy: 51.7 ng/mL (ref 30.0–100.0)

## 2020-09-08 LAB — URINE CULTURE

## 2020-09-12 NOTE — Progress Notes (Signed)
Hello Benedetta, ° °Your lab result is normal and/or stable.Some minor variations that are not significant are commonly marked abnormal, but do not represent any medical problem for you. ° °Best regards, °Tuyen Uncapher, M.D.

## 2020-10-05 DIAGNOSIS — Z23 Encounter for immunization: Secondary | ICD-10-CM | POA: Diagnosis not present

## 2020-11-22 ENCOUNTER — Ambulatory Visit: Payer: Medicaid Other | Admitting: Family Medicine

## 2020-11-22 ENCOUNTER — Other Ambulatory Visit: Payer: Self-pay

## 2020-11-22 ENCOUNTER — Encounter: Payer: Self-pay | Admitting: Family Medicine

## 2020-11-22 VITALS — BP 117/81 | HR 69 | Temp 98.0°F | Ht 64.0 in | Wt 206.8 lb

## 2020-11-22 DIAGNOSIS — Z114 Encounter for screening for human immunodeficiency virus [HIV]: Secondary | ICD-10-CM

## 2020-11-22 DIAGNOSIS — I10 Essential (primary) hypertension: Secondary | ICD-10-CM

## 2020-11-22 DIAGNOSIS — N920 Excessive and frequent menstruation with regular cycle: Secondary | ICD-10-CM | POA: Diagnosis not present

## 2020-11-22 DIAGNOSIS — Z1159 Encounter for screening for other viral diseases: Secondary | ICD-10-CM

## 2020-11-22 DIAGNOSIS — K580 Irritable bowel syndrome with diarrhea: Secondary | ICD-10-CM

## 2020-11-22 DIAGNOSIS — M79672 Pain in left foot: Secondary | ICD-10-CM

## 2020-11-22 DIAGNOSIS — L2084 Intrinsic (allergic) eczema: Secondary | ICD-10-CM

## 2020-11-22 DIAGNOSIS — N946 Dysmenorrhea, unspecified: Secondary | ICD-10-CM

## 2020-11-22 DIAGNOSIS — G8929 Other chronic pain: Secondary | ICD-10-CM | POA: Diagnosis not present

## 2020-11-22 DIAGNOSIS — E559 Vitamin D deficiency, unspecified: Secondary | ICD-10-CM

## 2020-11-22 DIAGNOSIS — K219 Gastro-esophageal reflux disease without esophagitis: Secondary | ICD-10-CM | POA: Diagnosis not present

## 2020-11-22 DIAGNOSIS — D509 Iron deficiency anemia, unspecified: Secondary | ICD-10-CM

## 2020-11-22 MED ORDER — BETAMETHASONE VALERATE 0.1 % EX CREA
TOPICAL_CREAM | CUTANEOUS | 2 refills | Status: DC
Start: 2020-11-22 — End: 2021-02-21

## 2020-11-22 MED ORDER — DICYCLOMINE HCL 20 MG PO TABS: 20.0000 mg | ORAL_TABLET | Freq: Three times a day (TID) | ORAL | 2 refills | Status: DC

## 2020-11-22 MED ORDER — OMEPRAZOLE 40 MG PO CPDR: 40.0000 mg | DELAYED_RELEASE_CAPSULE | Freq: Two times a day (BID) | ORAL | 2 refills | Status: DC

## 2020-11-22 NOTE — Progress Notes (Signed)
Subjective:  Patient ID: Alyssa Arellano, female    DOB: 09-16-1976  Age: 45 y.o. MRN: [id number]  CC: Follow-up (3 month/)   HPI Alyssa Arellano presents for  follow-up of dysmenorrhea Alyssa Arellano tells me that she is having regular periods lasting 5 days with heavy bleeding.  She has some small amount of leakage in between the periods as well.  She is not having anywhere near as much pain.  She is taking the Lo/Ovral as directed.   hypertension. Patient has no history of headache chest pain or shortness of breath or recent cough. Patient also denies symptoms of TIA such as focal numbness or weakness. Patient denies side effects from medication. States taking it regularly.  Patient reports that she has seen dermatology many years ago.  She has dry skin and it gets worse in the winter.  She would like to go back to the same doctor we can locate them.  She thinks it was Dr. Nicole Kindred.  Currently she is taking Valisone with partial relief.   Follow-up of hypertension. Patient has no history of headache chest pain or shortness of breath or recent cough. Patient also denies symptoms of TIA such as numbness weakness lateralizing. Patient checks  blood pressure at home and has not had any elevated readings recently. Patient denies side effects from his medication. States taking it regularly.   History Alyssa Arellano has a past medical history of Anxiety, GERD (gastroesophageal reflux disease), IBS (irritable bowel syndrome), PONV (postoperative nausea and vomiting), and Skin disorder.   She has a past surgical history that includes Cesarean section (2006) and Ganglion cyst excision (Right, 12/18/2013).   Her family history includes Cancer in her father.She reports that she has never smoked. She has never used smokeless tobacco. She reports that she does not drink alcohol and does not use drugs.  Current Outpatient Medications on File Prior to Visit  Medication Sig Dispense Refill  . acetaminophen (TYLENOL)  500 MG tablet Take 1,000 mg by mouth every 6 (six) hours as needed for moderate pain.    . calcium carbonate (TUMS EX) 750 MG chewable tablet Chew 1 tablet by mouth as needed for heartburn.    . ferrous sulfate 325 (65 FE) MG EC tablet Take 325 mg by mouth in the morning and at bedtime.    . fluticasone (FLONASE) 50 MCG/ACT nasal spray SPRAY 2 SPRAYS INTO EACH NOSTRIL EVERY DAY 16 mL 6  . hydrochlorothiazide (HYDRODIURIL) 25 MG tablet Take 1 tablet (25 mg total) by mouth daily. 30 tablet 5  . meloxicam (MOBIC) 15 MG tablet Take 1 tablet (15 mg total) by mouth daily. For joint and muscle pain 30 tablet 5  . norgestrel-ethinyl estradiol (LO/OVRAL) 0.3-30 MG-MCG tablet Take 1 tablet by mouth daily. 28 tablet 11  . solifenacin (VESICARE) 10 MG tablet Take 1 tablet (10 mg total) by mouth daily. For bladder control 30 tablet 5  . Vitamin D, Ergocalciferol, (DRISDOL) 1.25 MG (50000 UNIT) CAPS capsule Take 1 capsule (50,000 Units total) by mouth every 7 (seven) days. 13 capsule 3   No current facility-administered medications on file prior to visit.    ROS Review of Systems  Constitutional: Negative.   HENT: Negative for congestion.   Eyes: Negative for visual disturbance.  Respiratory: Negative for shortness of breath.   Cardiovascular: Negative for chest pain.  Gastrointestinal: Negative for abdominal pain, constipation, diarrhea, nausea and vomiting.  Genitourinary: Negative for difficulty urinating.  Musculoskeletal: Positive for arthralgias (Left heel). Negative for  myalgias.  Skin: Positive for rash (Overall dry skin with some excoriation noted within a patch on the leg.).  Neurological: Negative for headaches.  Psychiatric/Behavioral: Negative for sleep disturbance.    Objective:  BP 117/81   Pulse 69   Temp 98 F (36.7 C) (Temporal)   Ht _0  (1.626 m)   Wt 206 lb 12.8 oz (93.8 kg)   BMI 35.50 kg/m   BP Readings from Last 3 Encounters:  11/22/20 117/81  09/06/20 126/83   02/23/20 132/86    Wt Readings from Last 3 Encounters:  11/22/20 206 lb 12.8 oz (93.8 kg)  09/06/20 210 lb (95.3 kg)  02/23/20 212 lb 9.6 oz (96.4 kg)     Physical Exam Constitutional:      General: She is not in acute distress.    Appearance: She is well-developed and well-nourished.  HENT:     Head: Normocephalic and atraumatic.  Eyes:     Conjunctiva/sclera: Conjunctivae normal.     Pupils: Pupils are equal, round, and reactive to light.  Neck:     Thyroid: No thyromegaly.  Cardiovascular:     Rate and Rhythm: Normal rate and regular rhythm.     Heart sounds: Normal heart sounds. No murmur heard.   Pulmonary:     Effort: Pulmonary effort is normal. No respiratory distress.     Breath sounds: Normal breath sounds. No wheezing or rales.  Abdominal:     General: Bowel sounds are normal. There is no distension.     Palpations: Abdomen is soft.     Tenderness: There is no abdominal tenderness.  Musculoskeletal:        General: Normal range of motion.     Cervical back: Normal range of motion and neck supple.  Lymphadenopathy:     Cervical: No cervical adenopathy.  Skin:    General: Skin is warm and dry.     Findings: Lesion (Dry skin with some papular erythema intermittently sparsely over the legs.  1 area on the leg showed some excoriation and scaling about 3 cm.) present.  Neurological:     Mental Status: She is alert and oriented to person, place, and time.  Psychiatric:        Mood and Affect: Mood and affect normal.        Behavior: Behavior normal.        Thought Content: Thought content normal.        Judgment: Judgment normal.       Assessment & Plan:   Alyssa Arellano was seen today for follow-up.  Diagnoses and all orders for this visit:  Vitamin D deficiency -     CBC with Differential/Platelet -     CMP14+EGFR -     VITAMIN D 25 Hydroxy (Vit-D Deficiency, Fractures)  Gastroesophageal reflux disease without esophagitis -     omeprazole (PRILOSEC)  40 MG capsule; Take 1 capsule (40 mg total) by mouth 2 (two) times daily. On an empty stomach -     CBC with Differential/Platelet -     CMP14+EGFR  Irritable bowel syndrome with diarrhea -     dicyclomine (BENTYL) 20 MG tablet; Take 1 tablet (20 mg total) by mouth 4 (four) times daily -  before meals and at bedtime. -     CBC with Differential/Platelet -     CMP14+EGFR  Iron deficiency anemia, unspecified iron deficiency anemia type -     CBC with Differential/Platelet -     CMP14+EGFR  Essential hypertension -  CBC with Differential/Platelet -     CMP14+EGFR -     Lipid panel  Need for hepatitis C screening test -     CBC with Differential/Platelet -     CMP14+EGFR -     Hepatitis C antibody  Encounter for screening for HIV -     CBC with Differential/Platelet -     CMP14+EGFR -     HIV Antibody (routine testing w rflx)  Dysmenorrhea -     Ambulatory referral to Gynecology  Menorrhagia with regular cycle -     Ambulatory referral to Gynecology  Heel pain, chronic, left -     Ambulatory referral to Podiatry  Intrinsic eczema -     Ambulatory referral to Dermatology  Other orders -     betamethasone valerate (VALISONE) 0.1 % cream; APPLY TO AFFECTED AREA TWICE A DAY   Allergies as of 11/22/2020      Reactions   Penicillins       Medication List       Accurate as of November 22, 2020  9:32 AM. If you have any questions, ask your nurse or doctor.        acetaminophen 500 MG tablet Commonly known as: TYLENOL Take 1,000 mg by mouth every 6 (six) hours as needed for moderate pain.   betamethasone valerate 0.1 % cream Commonly known as: VALISONE APPLY TO AFFECTED AREA TWICE A DAY   calcium carbonate 750 MG chewable tablet Commonly known as: TUMS EX Chew 1 tablet by mouth as needed for heartburn.   dicyclomine 20 MG tablet Commonly known as: BENTYL Take 1 tablet (20 mg total) by mouth 4 (four) times daily -  before meals and at bedtime.   ferrous  sulfate 325 (65 FE) MG EC tablet Take 325 mg by mouth in the morning and at bedtime.   fluticasone 50 MCG/ACT nasal spray Commonly known as: FLONASE SPRAY 2 SPRAYS INTO EACH NOSTRIL EVERY DAY   hydrochlorothiazide 25 MG tablet Commonly known as: HYDRODIURIL Take 1 tablet (25 mg total) by mouth daily.   meloxicam 15 MG tablet Commonly known as: MOBIC Take 1 tablet (15 mg total) by mouth daily. For joint and muscle pain   norgestrel-ethinyl estradiol 0.3-30 MG-MCG tablet Commonly known as: LO/OVRAL Take 1 tablet by mouth daily.   omeprazole 40 MG capsule Commonly known as: PRILOSEC Take 1 capsule (40 mg total) by mouth 2 (two) times daily. On an empty stomach   solifenacin 10 MG tablet Commonly known as: VESIcare Take 1 tablet (10 mg total) by mouth daily. For bladder control   Vitamin D (Ergocalciferol) 1.25 MG (50000 UNIT) Caps capsule Commonly known as: DRISDOL Take 1 capsule (50,000 Units total) by mouth every 7 (seven) days.       Meds ordered this encounter  Medications  . omeprazole (PRILOSEC) 40 MG capsule    Sig: Take 1 capsule (40 mg total) by mouth 2 (two) times daily. On an empty stomach    Dispense:  60 capsule    Refill:  2  . dicyclomine (BENTYL) 20 MG tablet    Sig: Take 1 tablet (20 mg total) by mouth 4 (four) times daily -  before meals and at bedtime.    Dispense:  120 tablet    Refill:  2  . betamethasone valerate (VALISONE) 0.1 % cream    Sig: APPLY TO AFFECTED AREA TWICE A DAY    Dispense:  45 g    Refill:  2   I  would like for the patient to be evaluated by gynecology.  Her recent ultrasound does not show any ovarian cysts.  That was about 8 or 9 months ago.  Her symptoms have improved but not resolved with low overall.  Gynecologic evaluation to rule out any further pathology.  Polycystic ovarian disease in spite of a normal ultrasound might be worthwhile as result of her body habitus.  On the other's hand her body habitus may be the reason for  the heavy periods.  Would just like to have a more satisfactory explanation since she is only gotten partial relief at this time.  She is have dermatologic care for her eczema and desires that again.  She can continue the Valisone and moisturizers in the meantime.  The heel pain is not responded to over-the-counter treatments heel cups etc.  She saw podiatrist apparently at some point but she does not remember who.  We will try to get her back to podiatry, the same 1, if we can locate the provider. Follow-up: Return in about 6 months (around 05/23/2021).  Claretta Fraise, M.D.

## 2020-12-06 ENCOUNTER — Ambulatory Visit (INDEPENDENT_AMBULATORY_CARE_PROVIDER_SITE_OTHER): Payer: Medicaid Other | Admitting: Podiatry

## 2020-12-06 ENCOUNTER — Other Ambulatory Visit: Payer: Self-pay

## 2020-12-06 ENCOUNTER — Ambulatory Visit (INDEPENDENT_AMBULATORY_CARE_PROVIDER_SITE_OTHER): Payer: Medicaid Other

## 2020-12-06 DIAGNOSIS — M722 Plantar fascial fibromatosis: Secondary | ICD-10-CM

## 2020-12-06 DIAGNOSIS — M79672 Pain in left foot: Secondary | ICD-10-CM

## 2020-12-06 MED ORDER — TRIAMCINOLONE ACETONIDE 10 MG/ML IJ SUSP
10.0000 mg | Freq: Once | INTRAMUSCULAR | Status: AC
  Administered 2020-12-06: 10:00:00 10 mg

## 2020-12-06 NOTE — Patient Instructions (Signed)

## 2020-12-09 NOTE — Progress Notes (Signed)
Subjective:   Patient ID: Alyssa Arellano, female   DOB: 44 y.o.   MRN: [id number]   HPI 44 year old female presents to the office today for concerns of left heel pain on the bottom of the heel which is on over the last couple of months.  She states that changes difficult to put pressure to the bottom of the heel.  She will occasionally sharp pain to the heel.  No rating pain or weakness.  No recent treatment.  No injury.  No other concerns.   Review of Systems  All other systems reviewed and are negative.  Past Medical History:  Diagnosis Date  . Anxiety   . GERD (gastroesophageal reflux disease)   . IBS (irritable bowel syndrome)   . PONV (postoperative nausea and vomiting)   . Skin disorder    possibly psoriosis, but pt is unsure. Causes red, spotchy, dry spots on body when it gets cold.    Past Surgical History:  Procedure Laterality Date  . CESAREAN SECTION  2006   [facility]-Dr. Ralph Dowdy  . GANGLION CYST EXCISION Right 12/18/2013   Procedure: REMOVAL GANGLION CYST RIGHT FOOT;  Surgeon: Vickki Hearing, MD;  Location: [facility];  Service: Orthopedics;  Laterality: Right;     Current Outpatient Medications:  .  acetaminophen (TYLENOL) 500 MG tablet, Take 1,000 mg by mouth every 6 (six) hours as needed for moderate pain., Disp: , Rfl:  .  betamethasone valerate (VALISONE) 0.1 % cream, APPLY TO AFFECTED AREA TWICE A DAY, Disp: 45 g, Rfl: 2 .  calcium carbonate (TUMS EX) 750 MG chewable tablet, Chew 1 tablet by mouth as needed for heartburn., Disp: , Rfl:  .  dicyclomine (BENTYL) 20 MG tablet, Take 1 tablet (20 mg total) by mouth 4 (four) times daily -  before meals and at bedtime., Disp: 120 tablet, Rfl: 2 .  ferrous sulfate 325 (65 FE) MG EC tablet, Take 325 mg by mouth in the morning and at bedtime., Disp: , Rfl:  .  fluticasone (FLONASE) 50 MCG/ACT nasal spray, SPRAY 2 SPRAYS INTO EACH NOSTRIL EVERY DAY, Disp: 16 mL, Rfl: 6 .  hydrochlorothiazide (HYDRODIURIL) 25 MG tablet,  Take 1 tablet (25 mg total) by mouth daily., Disp: 30 tablet, Rfl: 5 .  meloxicam (MOBIC) 15 MG tablet, Take 1 tablet (15 mg total) by mouth daily. For joint and muscle pain, Disp: 30 tablet, Rfl: 5 .  norgestrel-ethinyl estradiol (LO/OVRAL) 0.3-30 MG-MCG tablet, Take 1 tablet by mouth daily., Disp: 28 tablet, Rfl: 11 .  omeprazole (PRILOSEC) 40 MG capsule, Take 1 capsule (40 mg total) by mouth 2 (two) times daily. On an empty stomach, Disp: 60 capsule, Rfl: 2 .  solifenacin (VESICARE) 10 MG tablet, Take 1 tablet (10 mg total) by mouth daily. For bladder control, Disp: 30 tablet, Rfl: 5 .  Vitamin D, Ergocalciferol, (DRISDOL) 1.25 MG (50000 UNIT) CAPS capsule, Take 1 capsule (50,000 Units total) by mouth every 7 (seven) days., Disp: 13 capsule, Rfl: 3  Allergies  Allergen Reactions  . Penicillins           Objective:  Physical Exam  General: AAO x3, NAD  Dermatological: Skin is warm, dry and supple bilateral.  There are no open sores, no preulcerative lesions, no rash or signs of infection present.  Vascular: Dorsalis Pedis artery and Posterior Tibial artery pedal pulses are 2/4 bilateral with immedate capillary fill time. There is no pain with calf compression, swelling, warmth, erythema.   Neruologic: Grossly intact via  light touch bilateral. Negative tinel sign.   Musculoskeletal: Tenderness to palpation along the plantar medial tubercle of the calcaneus at the insertion of plantar fascia on the left foot. There is no pain along the course of the plantar fascia within the arch of the foot. Plantar fascia appears to be intact. There is no pain with lateral compression of the calcaneus or pain with vibratory sensation. There is no pain along the course or insertion of the achilles tendon. No other areas of tenderness to bilateral lower extremities. Muscular strength 5/5 in all groups tested bilateral.  Gait: Unassisted, Nonantalgic.       Assessment:   Left heel pain, plantar  fasciitis     Plan:  -Treatment options discussed including all alternatives, risks, and complications -Etiology of symptoms were discussed -X-rays were obtained and reviewed with the patient.  No evidence of acute fracture or stress fracture identified today. -Steroid injection.  See procedure note below. -Discussed stretching, icing daily. -Discussed shoe modifications and orthotics.  Vivi Barrack DPM

## 2020-12-27 ENCOUNTER — Ambulatory Visit (INDEPENDENT_AMBULATORY_CARE_PROVIDER_SITE_OTHER): Payer: Medicaid Other | Admitting: Adult Health

## 2020-12-27 ENCOUNTER — Encounter: Payer: Self-pay | Admitting: Adult Health

## 2020-12-27 ENCOUNTER — Other Ambulatory Visit: Payer: Self-pay

## 2020-12-27 VITALS — BP 119/83 | HR 76 | Ht 64.0 in | Wt 207.0 lb

## 2020-12-27 DIAGNOSIS — N921 Excessive and frequent menstruation with irregular cycle: Secondary | ICD-10-CM | POA: Diagnosis not present

## 2020-12-27 DIAGNOSIS — Z3202 Encounter for pregnancy test, result negative: Secondary | ICD-10-CM | POA: Insufficient documentation

## 2020-12-27 LAB — POCT URINE PREGNANCY: Preg Test, Ur: NEGATIVE

## 2020-12-27 MED ORDER — MEGESTROL ACETATE 40 MG PO TABS: ORAL_TABLET | ORAL | 2 refills | Status: DC

## 2020-12-27 NOTE — Addendum Note (Signed)
Addended by: Colen Darling on: 12/27/2020 11:38 AM   Modules accepted: Orders

## 2020-12-27 NOTE — Progress Notes (Signed)
Patient ID: Alyssa Arellano, female   DOB: Apr 08, 1976, 45 y.o.   MRN: [id number] History of Present Illness:  Alyssa Arellano is a 45 year old white female, single, G2P2 in complaining of heavy periods and irregular bleeding on the pill, she is sp tubal ligation.She had normal pelvic US in March 2021. She has 6 day periods on the pill then may have 2-3 days of bleeding later. PCP is Dr Darlyn Read.   Current Medications, Allergies, Past Medical History, Past Surgical History, Family History and Social History were reviewed in [employer] record.     Review of Systems:  Patient denies any headaches, hearing loss, fatigue, blurred vision, shortness of breath, chest pain, abdominal pain, problems with bowel movements, urination, or intercourse. No joint pain or mood swings. Has heavy periods with irregular bleeding, changes every 3-4 hours, has clots, no pain since starting OCs, bleeding lasts about  6 days.   Physical Exam:BP 119/83 (BP Location: Left Arm, Patient Position: Sitting, Cuff Size: Large)   Pulse 76   Ht 5\' 4"  (1.626 m)   Wt 207 lb (93.9 kg)   LMP 12/14/2020   BMI 35.53 kg/m UPT is negative. General:  Well developed, well nourished, no acute distress Skin:  Warm and dry Lungs; Clear to auscultation bilaterally Cardiovascular: Regular rate and rhythm Pelvic:  External genitalia is normal in appearance, no lesions.  The vagina is normal in appearance. Urethra has no lesions or masses. The cervix is bulbous.  Uterus is felt to be normal size, shape, and contour.  No adnexal masses or tenderness noted.Bladder is non tender, no masses felt. Extremities/musculoskeletal:  No swelling or varicosities noted, no clubbing or cyanosis Psych:  No mood changes, alert and cooperative,seems happy AA is 0 Fall risk is low PHQ 9 score is 0 GAD 7 score is 0  [facility] - 12/27/20 1109      Pregnancy Intention Screening   Does the patient want to become pregnant in the next year? No     Does the patient's partner want to become pregnant in the next year? No    Would the patient like to discuss contraceptive options today? No      Contraception Wrap Up   Current Method Female Sterilization    End Method Female Sterilization    Contraception Counseling Provided No         Examination chaperoned by 12/29/20 LPN   Impression and Plan:  1. Pregnancy examination or test, negative result  2. Menorrhagia with irregular cycle Will stop OCs today and start megace tomorrow Gave handout on endometrial ablation as option Follow up in 8 weeks or sooner if needed  Will send GC/CHL on urine

## 2020-12-28 LAB — GC/CHLAMYDIA PROBE AMP
Chlamydia trachomatis, NAA: NEGATIVE
Neisseria Gonorrhoeae by PCR: NEGATIVE

## 2021-01-03 ENCOUNTER — Ambulatory Visit (INDEPENDENT_AMBULATORY_CARE_PROVIDER_SITE_OTHER): Payer: Medicaid Other | Admitting: Podiatry

## 2021-01-03 ENCOUNTER — Other Ambulatory Visit: Payer: Self-pay

## 2021-01-03 DIAGNOSIS — M722 Plantar fascial fibromatosis: Secondary | ICD-10-CM | POA: Diagnosis not present

## 2021-01-03 MED ORDER — TRIAMCINOLONE ACETONIDE 10 MG/ML IJ SUSP
10.0000 mg | Freq: Once | INTRAMUSCULAR | Status: AC
  Administered 2021-01-03: 10 mg

## 2021-01-03 NOTE — Patient Instructions (Signed)

## 2021-01-04 NOTE — Progress Notes (Signed)
Subjective: 45 year old female presents the office today for evaluation of left foot heel pain, plantar fasciitis.  She said the injection did help some but she still in discomfort.  She is wearing the plantar surface.  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 Denies any systemic complaints such as fevers, chills, nausea, vomiting. No acute changes since last appointment, and no other complaints at this time.   Objective: AAO x3, NAD DP/PT pulses palpable bilaterally, CRT less than 3 seconds There is continuation tenderness palpation on plantar medial tubercle of the calcaneus at the insertion plantar fascial left side. Plantar fascia appears to be intact.  No pain with Achilles tendon.  No pain on the ankle and no pain on flexion of extensor tendons.  Negative Tinel sign. No pain with calf compression, swelling, warmth, erythema  Assessment: Heel pain, plantar fasciitis left side  Plan: -All treatment options discussed with the patient including all alternatives, risks, complications.  -Second steroid injection performed.  See procedure note below. -Medrol dose pack.  Hold anti-inflammatories while taking this.  Continue stretching, icing daily.  Prescription modifications and also orthotics.  She is instructed on over-the-counter inserts which we discussed with the look for today. -Patient encouraged to call the office with any questions, concerns, change in symptoms.   Procedure: Injection Tendon/Ligament Discussed alternatives, risks, complications and verbal consent was obtained.  Location: Left  plantar fascia at the glabrous junction; medial approach. Skin Prep: Alcohol  Injectate: 0.5cc 0.5% marcaine plain, 0.5 cc 2% lidocaine plain and, 1 cc kenalog 10. Disposition: Patient tolerated procedure well. Injection site dressed with a band-aid.  Post-injection care was discussed and return precautions discussed.   Return in about  6 weeks (around 02/14/2021).  Vivi Barrack DPM

## 2021-02-12 IMAGING — US US PELVIS COMPLETE WITH TRANSVAGINAL
1 series · 14 of 25 positions shown · non-contrast
Comparison: None

CLINICAL DATA: Menorrhagia with regular cycle

EXAM:
TRANSABDOMINAL AND TRANSVAGINAL ULTRASOUND OF PELVIS
TECHNIQUE: Both transabdominal and transvaginal ultrasound examinations of the
pelvis were performed. Transabdominal technique was performed for
global imaging of the pelvis including uterus, ovaries, adnexal
regions, and pelvic cul-de-sac. It was necessary to proceed with
endovaginal exam following the transabdominal exam to visualize the
endometrium and ovaries.

[Series 1: us pelvic complete with transvaginal · 14 of 67 slices shown]
[im 1/67]
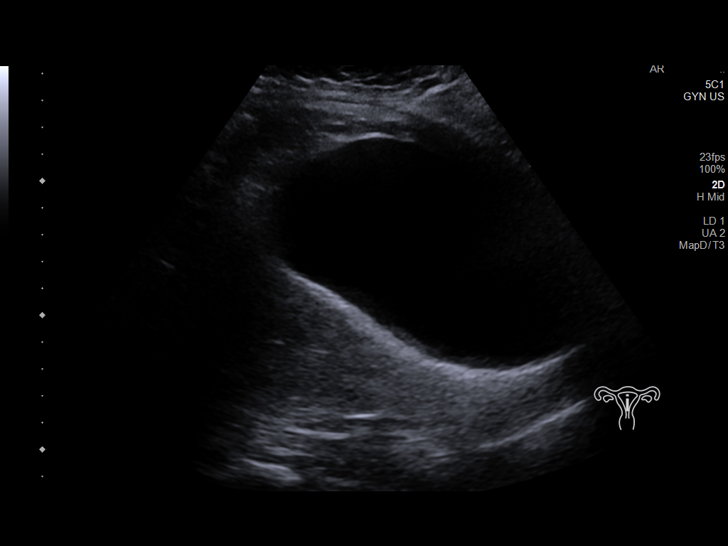
[im 6/67]
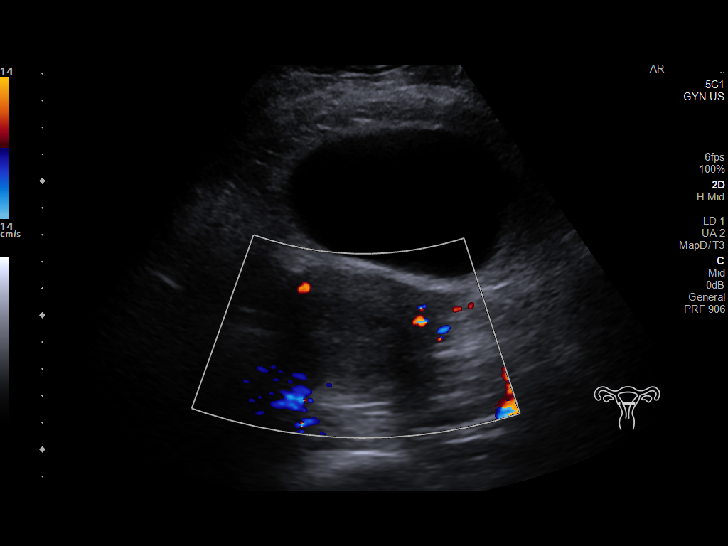
[im 12/67]
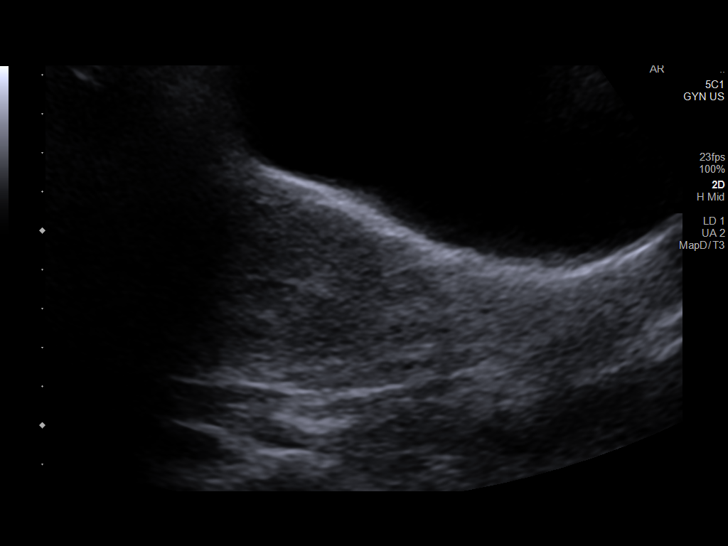
[im 17/67]
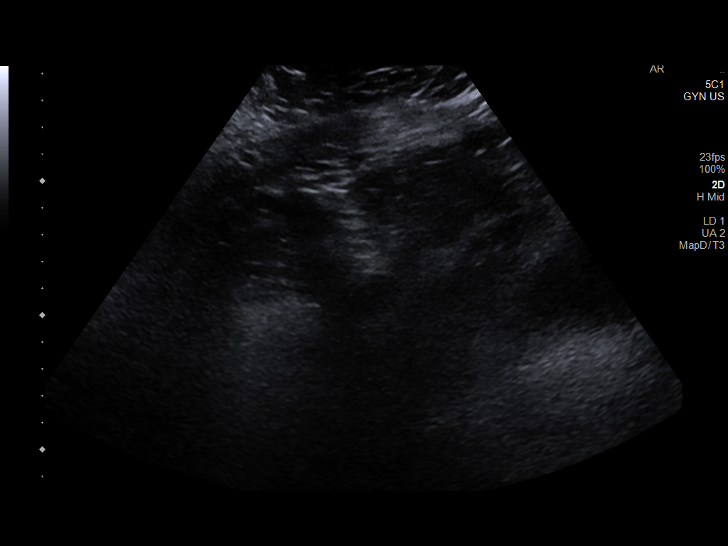
[im 23/67]
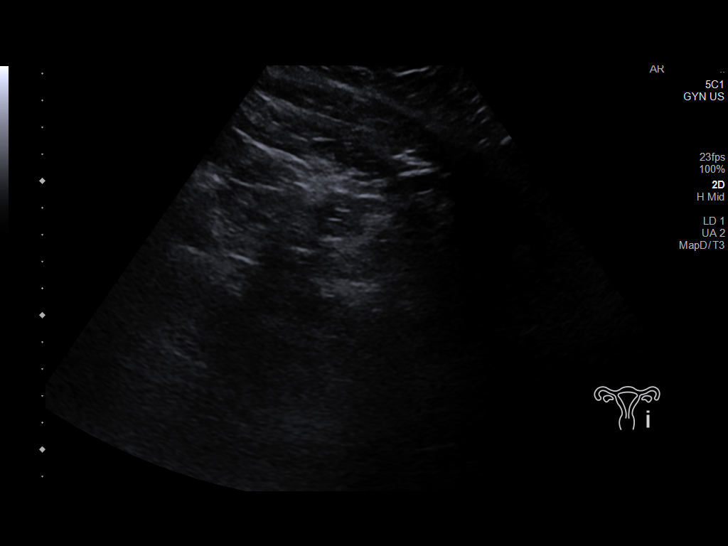
[im 25/67]
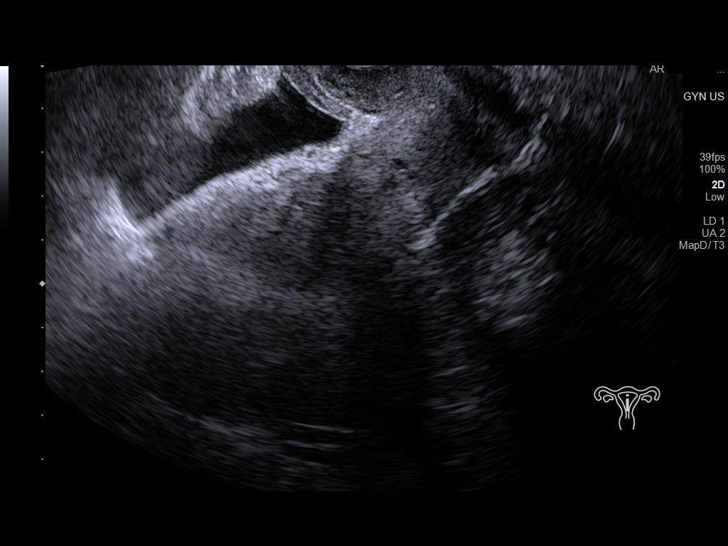
[im 31/67]
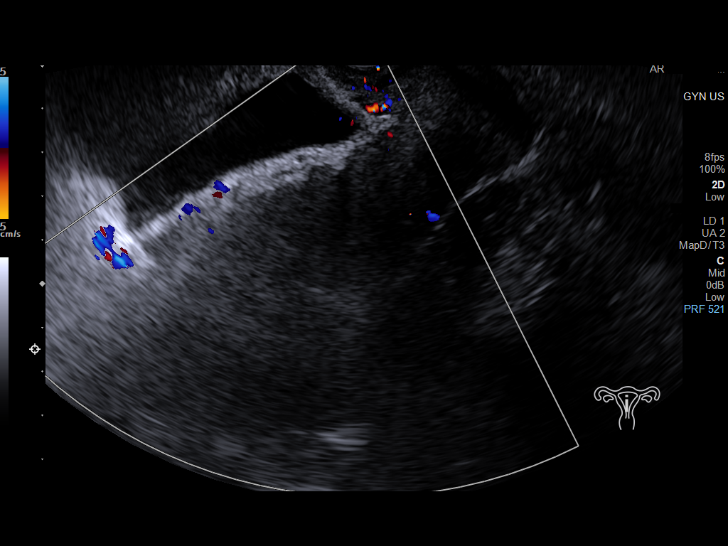
[im 36/67]
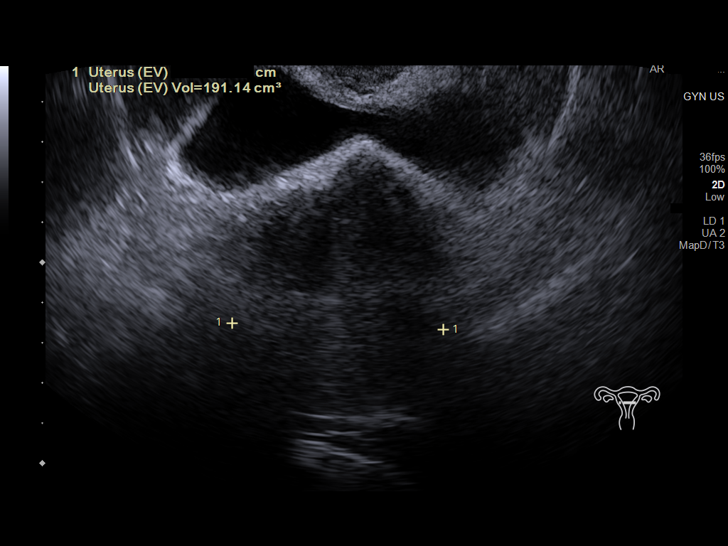
[im 42/67]
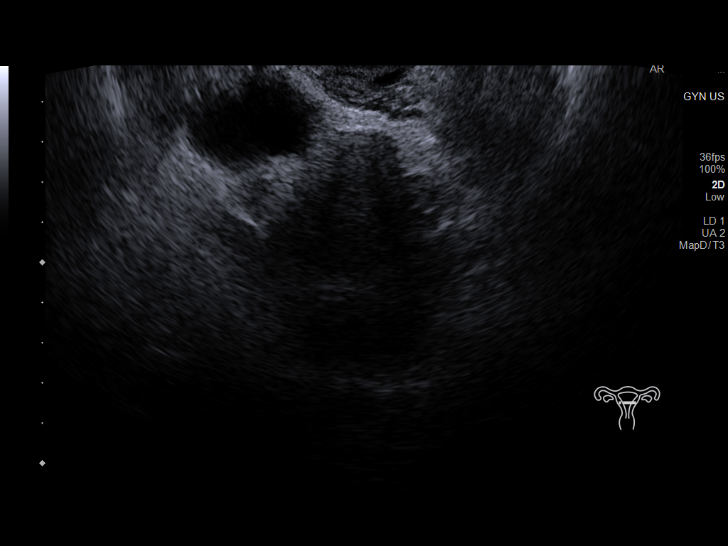
[im 45/67]
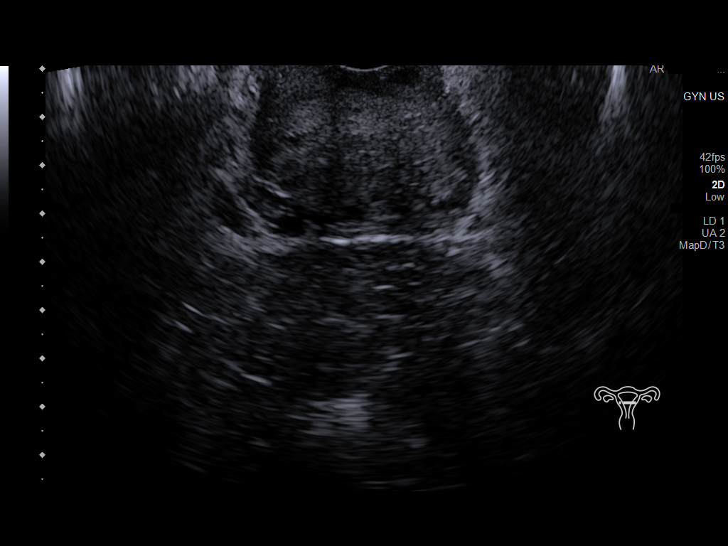
[im 50/67]
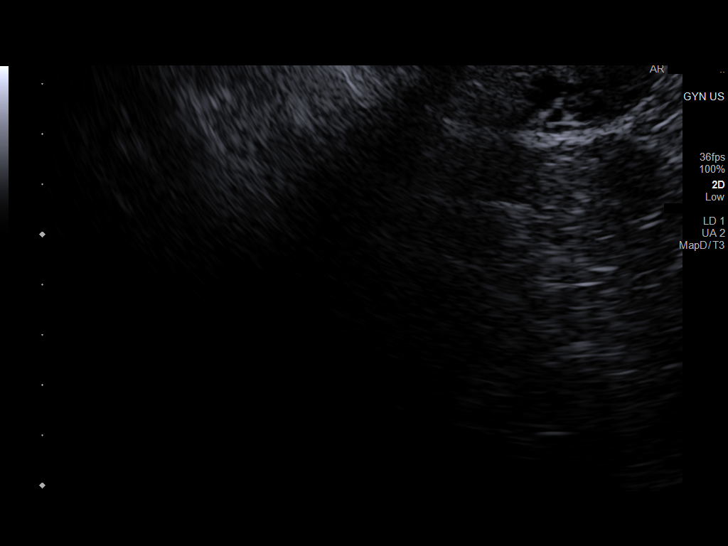
[im 56/67]
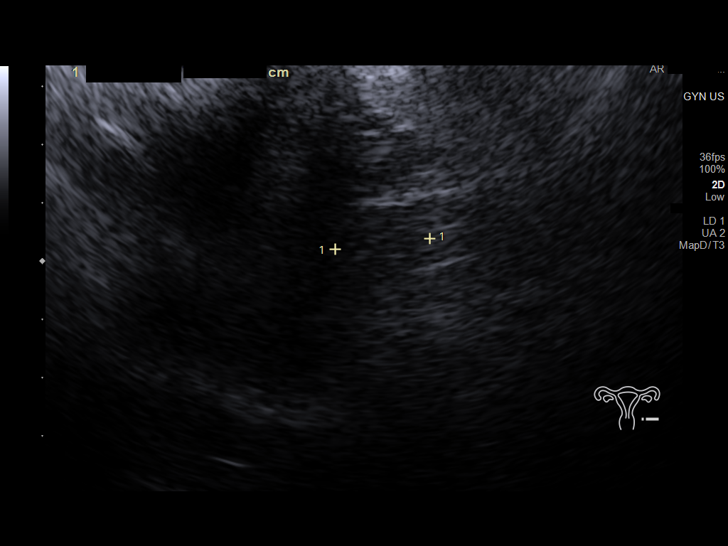
[im 61/67]
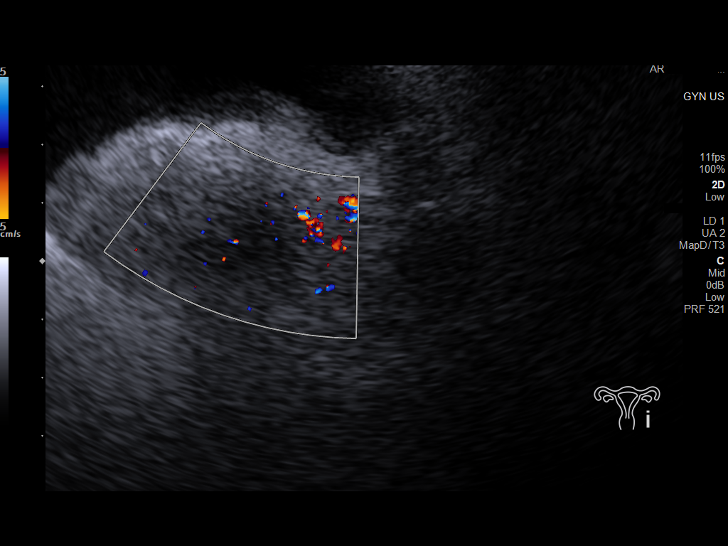
[im 67/67]
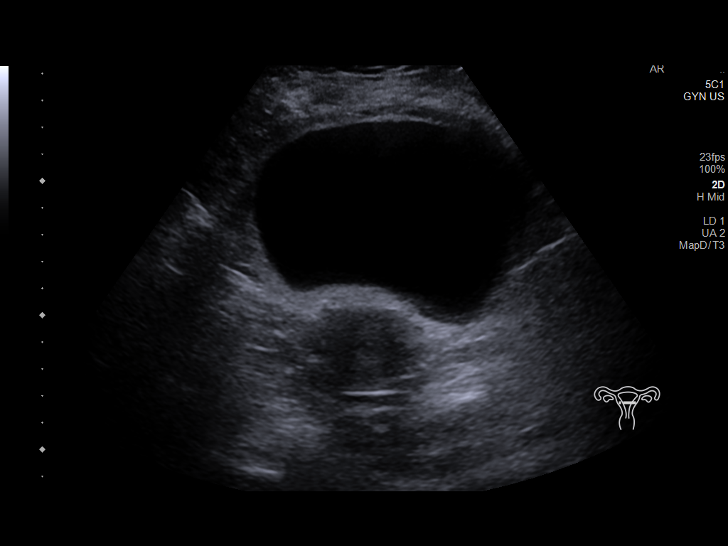

[14 of 25 positions shown; findings below may reference images not displayed]

FINDINGS: Uterus

Measurements: 10.8 x 6.4 x 5.3 cm = volume: 191 mL. Anteverted.
Suboptimally visualized on transvaginal imaging. No definite uterine
mass

Endometrium

Thickness: 5 mm.  No endometrial fluid or focal abnormality

Right ovary

Measurements: 1.9 x 1.5 x 1.7 cm = volume: 2.5 mL. Suboptimally
visualized. No gross mass.

Left ovary

Measurements: 1.9 x 2.7 x 1.6 cm = volume: 2.7 mL. Suboptimally
visualized, no gross mass.

Other findings

No free pelvic fluid or adnexal masses.
IMPRESSION: No pelvic sonographic abnormalities identified.

## 2021-02-14 ENCOUNTER — Other Ambulatory Visit: Payer: Self-pay

## 2021-02-14 ENCOUNTER — Ambulatory Visit: Payer: Medicaid Other | Admitting: Podiatry

## 2021-02-14 DIAGNOSIS — M722 Plantar fascial fibromatosis: Secondary | ICD-10-CM | POA: Diagnosis not present

## 2021-02-14 DIAGNOSIS — M79672 Pain in left foot: Secondary | ICD-10-CM | POA: Diagnosis not present

## 2021-02-14 MED ORDER — IBUPROFEN 800 MG PO TABS: 800.0000 mg | ORAL_TABLET | Freq: Three times a day (TID) | ORAL | 0 refills | Status: DC | PRN

## 2021-02-14 NOTE — Progress Notes (Signed)
Subjective: 45 year old female presents the office today for follow evaluation of heel pain, plan fasciitis in the left side.  She gets pain this point in the bottom of the heel as well as an occasional burning.  No rating pain or weakness.  She does state it is better overall.  The injection was helpful but she still has discomfort.  She has not been stretching on a regular basis and she describes the tightness in both of her legs that she had previously as well. Denies any systemic complaints such as fevers, chills, nausea, vomiting. No acute changes since last appointment, and no other complaints at this time.   Objective: AAO x3, NAD DP/PT pulses palpable bilaterally, CRT less than 3 seconds There is continuation tenderness on the plantar aspect of the foot on the insertion of the plantar fascia on the medial aspect.  There is no pain with compression of calcaneus.  There is negative Tinel's sign.  No edema, erythema.  Equinus is present.  MMT 5/5.  No pain with calf compression, swelling, warmth, erythema  Assessment: Plantar fasciitis, neuritis left foot  Plan: -All treatment options discussed with the patient including all alternatives, risks, complications.  -I do still think much of her symptoms are plantar fasciitis.  She is a negative Tinel sign help with the burning as a nerve trap that I will need to monitor this.  We held off another steroid injection.  Right switch to using ibuprofen 800 mg and stop meloxicam.  On her to get back to doing stretching exercises diligently peer discussed formal physical therapy but she wants to hold off on that for now.  Continue supportive shoes, inserts that she has purchased as well. -Patient encouraged to call the office with any questions, concerns, change in symptoms.   Vivi Barrack DPM

## 2021-02-14 NOTE — Patient Instructions (Signed)
Look at getting a "night splint".   Plantar Fasciitis (Heel Spur Syndrome) with Rehab The plantar fascia is a fibrous, ligament-like, soft-tissue structure that spans the bottom of the foot. Plantar fasciitis is a condition that causes pain in the foot due to inflammation of the tissue. SYMPTOMS   Pain and tenderness on the underneath side of the foot.  Pain that worsens with standing or walking. CAUSES  Plantar fasciitis is caused by irritation and injury to the plantar fascia on the underneath side of the foot. Common mechanisms of injury include:  Direct trauma to bottom of the foot.  Damage to a small nerve that runs under the foot where the main fascia attaches to the heel bone.  Stress placed on the plantar fascia due to bone spurs. RISK INCREASES WITH:   Activities that place stress on the plantar fascia (running, jumping, pivoting, or cutting).  Poor strength and flexibility.  Improperly fitted shoes.  Tight calf muscles.  Flat feet.  Failure to warm-up properly before activity.  Obesity. PREVENTION  Warm up and stretch properly before activity.  Allow for adequate recovery between workouts.  Maintain physical fitness:  Strength, flexibility, and endurance.  Cardiovascular fitness.  Maintain a health body weight.  Avoid stress on the plantar fascia.  Wear properly fitted shoes, including arch supports for individuals who have flat feet.  PROGNOSIS  If treated properly, then the symptoms of plantar fasciitis usually resolve without surgery. However, occasionally surgery is necessary.  RELATED COMPLICATIONS   Recurrent symptoms that may result in a chronic condition.  Problems of the lower back that are caused by compensating for the injury, such as limping.  Pain or weakness of the foot during push-off following surgery.  Chronic inflammation, scarring, and partial or complete fascia tear, occurring more often from repeated injections.  TREATMENT   Treatment initially involves the use of ice and medication to help reduce pain and inflammation. The use of strengthening and stretching exercises may help reduce pain with activity, especially stretches of the Achilles tendon. These exercises may be performed at home or with a therapist. Your caregiver may recommend that you use heel cups of arch supports to help reduce stress on the plantar fascia. Occasionally, corticosteroid injections are given to reduce inflammation. If symptoms persist for greater than 6 months despite non-surgical (conservative), then surgery may be recommended.   MEDICATION   If pain medication is necessary, then nonsteroidal anti-inflammatory medications, such as aspirin and ibuprofen, or other minor pain relievers, such as acetaminophen, are often recommended.  Do not take pain medication within 7 days before surgery.  Prescription pain relievers may be given if deemed necessary by your caregiver. Use only as directed and only as much as you need.  Corticosteroid injections may be given by your caregiver. These injections should be reserved for the most serious cases, because they may only be given a certain number of times.  HEAT AND COLD  Cold treatment (icing) relieves pain and reduces inflammation. Cold treatment should be applied for 10 to 15 minutes every 2 to 3 hours for inflammation and pain and immediately after any activity that aggravates your symptoms. Use ice packs or massage the area with a piece of ice (ice massage).  Heat treatment may be used prior to performing the stretching and strengthening activities prescribed by your caregiver, physical therapist, or athletic trainer. Use a heat pack or soak the injury in warm water.  SEEK IMMEDIATE MEDICAL CARE IF:  Treatment seems to offer no  benefit, or the condition worsens.  Any medications produce adverse side effects.  EXERCISES- RANGE OF MOTION (ROM) AND STRETCHING EXERCISES - Plantar Fasciitis  (Heel Spur Syndrome) These exercises may help you when beginning to rehabilitate your injury. Your symptoms may resolve with or without further involvement from your physician, physical therapist or athletic trainer. While completing these exercises, remember:   Restoring tissue flexibility helps normal motion to return to the joints. This allows healthier, less painful movement and activity.  An effective stretch should be held for at least 30 seconds.  A stretch should never be painful. You should only feel a gentle lengthening or release in the stretched tissue.  RANGE OF MOTION - Toe Extension, Flexion  Sit with your right / left leg crossed over your opposite knee.  Grasp your toes and gently pull them back toward the top of your foot. You should feel a stretch on the bottom of your toes and/or foot.  Hold this stretch for 10 seconds.  Now, gently pull your toes toward the bottom of your foot. You should feel a stretch on the top of your toes and or foot.  Hold this stretch for 10 seconds. Repeat  times. Complete this stretch 3 times per day.   RANGE OF MOTION - Ankle Dorsiflexion, Active Assisted  Remove shoes and sit on a chair that is preferably not on a carpeted surface.  Place right / left foot under knee. Extend your opposite leg for support.  Keeping your heel down, slide your right / left foot back toward the chair until you feel a stretch at your ankle or calf. If you do not feel a stretch, slide your bottom forward to the edge of the chair, while still keeping your heel down.  Hold this stretch for 10 seconds. Repeat 3 times. Complete this stretch 2 times per day.   STRETCH  Gastroc, Standing  Place hands on wall.  Extend right / left leg, keeping the front knee somewhat bent.  Slightly point your toes inward on your back foot.  Keeping your right / left heel on the floor and your knee straight, shift your weight toward the wall, not allowing your back to  arch.  You should feel a gentle stretch in the right / left calf. Hold this position for 10 seconds. Repeat 3 times. Complete this stretch 2 times per day.  STRETCH  Soleus, Standing  Place hands on wall.  Extend right / left leg, keeping the other knee somewhat bent.  Slightly point your toes inward on your back foot.  Keep your right / left heel on the floor, bend your back knee, and slightly shift your weight over the back leg so that you feel a gentle stretch deep in your back calf.  Hold this position for 10 seconds. Repeat 3 times. Complete this stretch 2 times per day.  STRETCH  Gastrocsoleus, Standing  Note: This exercise can place a lot of stress on your foot and ankle. Please complete this exercise only if specifically instructed by your caregiver.   Place the ball of your right / left foot on a step, keeping your other foot firmly on the same step.  Hold on to the wall or a rail for balance.  Slowly lift your other foot, allowing your body weight to press your heel down over the edge of the step.  You should feel a stretch in your right / left calf.  Hold this position for 10 seconds.  Repeat this exercise  with a slight bend in your right / left knee. Repeat 3 times. Complete this stretch 2 times per day.   STRENGTHENING EXERCISES - Plantar Fasciitis (Heel Spur Syndrome)  These exercises may help you when beginning to rehabilitate your injury. They may resolve your symptoms with or without further involvement from your physician, physical therapist or athletic trainer. While completing these exercises, remember:   Muscles can gain both the endurance and the strength needed for everyday activities through controlled exercises.  Complete these exercises as instructed by your physician, physical therapist or athletic trainer. Progress the resistance and repetitions only as guided.  STRENGTH - Towel Curls  Sit in a chair positioned on a non-carpeted surface.  Place  your foot on a towel, keeping your heel on the floor.  Pull the towel toward your heel by only curling your toes. Keep your heel on the floor. Repeat 3 times. Complete this exercise 2 times per day.  STRENGTH - Ankle Inversion  Secure one end of a rubber exercise band/tubing to a fixed object (table, pole). Loop the other end around your foot just before your toes.  Place your fists between your knees. This will focus your strengthening at your ankle.  Slowly, pull your big toe up and in, making sure the band/tubing is positioned to resist the entire motion.  Hold this position for 10 seconds.  Have your muscles resist the band/tubing as it slowly pulls your foot back to the starting position. Repeat 3 times. Complete this exercises 2 times per day.  Document Released: 11/26/2005 Document Revised: 02/18/2012 Document Reviewed: 03/10/2009 [facility] Patient Information 2014 [place], [place].

## 2021-02-17 DIAGNOSIS — L853 Xerosis cutis: Secondary | ICD-10-CM | POA: Diagnosis not present

## 2021-02-17 DIAGNOSIS — L219 Seborrheic dermatitis, unspecified: Secondary | ICD-10-CM | POA: Diagnosis not present

## 2021-02-21 ENCOUNTER — Other Ambulatory Visit: Payer: Self-pay

## 2021-02-21 ENCOUNTER — Encounter: Payer: Self-pay | Admitting: Adult Health

## 2021-02-21 ENCOUNTER — Ambulatory Visit: Payer: Medicaid Other | Admitting: Adult Health

## 2021-02-21 VITALS — BP 121/84 | HR 68 | Ht 63.0 in | Wt 206.0 lb

## 2021-02-21 DIAGNOSIS — N921 Excessive and frequent menstruation with irregular cycle: Secondary | ICD-10-CM

## 2021-02-21 NOTE — Progress Notes (Signed)
  Subjective:     Patient ID: Alyssa Arellano, female   DOB: 01/19/1976, 45 y.o.   MRN: [id number]  HPI Alyssa Arellano is a 45 year old white female,single, G2P2, back in follow up on taking megace for heavy bleeding, still spotting. PCP is Dr Darlyn Read.  Review of Systems Bleeding not has heavy but still spotting Reviewed past medical,surgical, social and family history. Reviewed medications and allergies.     Objective:   Physical Exam BP 121/84 (BP Location: Left Arm, Patient Position: Sitting, Cuff Size: Large)   Pulse 68   Ht 5\' 3"  (1.6 m)   Wt 206 lb (93.4 kg)   BMI 36.49 kg/m   Skin warm and dry. Lungs: clear to ausculation bilaterally. Cardiovascular: regular rate and rhythm.    Fall risk is low  [facility] - 02/21/21 0931      Pregnancy Intention Screening   Does the patient want to become pregnant in the next year? No    Does the patient's partner want to become pregnant in the next year? No    Would the patient like to discuss contraceptive options today? No      Contraception Wrap Up   Current Method Female Sterilization    End Method Female Sterilization    Contraception Counseling Provided No          Assessment:     1. Menorrhagia with irregular cycle Continue megace     Plan:     Return in about 2 weeks to discuss ablation with Dr 02/23/21

## 2021-03-06 ENCOUNTER — Encounter: Payer: Self-pay | Admitting: Obstetrics & Gynecology

## 2021-03-06 ENCOUNTER — Ambulatory Visit: Payer: Medicaid Other | Admitting: Obstetrics & Gynecology

## 2021-03-06 ENCOUNTER — Other Ambulatory Visit: Payer: Self-pay

## 2021-03-06 VITALS — BP 121/81 | HR 68 | Ht 63.0 in | Wt 202.0 lb

## 2021-03-06 DIAGNOSIS — N921 Excessive and frequent menstruation with irregular cycle: Secondary | ICD-10-CM | POA: Diagnosis not present

## 2021-03-06 DIAGNOSIS — N946 Dysmenorrhea, unspecified: Secondary | ICD-10-CM | POA: Diagnosis not present

## 2021-03-06 NOTE — Progress Notes (Signed)
Follow up appointment for results  Chief Complaint  Patient presents with  . Discuss Ablation    Blood pressure 121/81, pulse 68, height 5\' 3"  (1.6 m), weight 202 lb (91.6 kg).  Alyssa Arellano is a 45 year old G2P2002 status post tubal ligation who is in with long history of heavy menstrual bleeding.  Her periods are mostly normal but she will have occasional months where she has 2 or 3 days of bleeding otherwise She describes her bleeding is heavy with clots she wears tampons during the day change them all the time and pads in the evening and at night She sleeps on a approved sheet in a towel She has sold her clothes in the past and has soiled her sheets She has minimal cramping that is not really a significant issue Sonogram in 2020 and in 2021 most recently reviewed by me today are normal There are no fibroids associated no endometrial polyps Please see the report below  She has been on megestrol since January with significant decrease in her volume of bleeding but she has almost daily spotting  CLINICAL DATA:  Menorrhagia with regular cycle  EXAM: TRANSABDOMINAL AND TRANSVAGINAL ULTRASOUND OF PELVIS  TECHNIQUE: Both transabdominal and transvaginal ultrasound examinations of the pelvis were performed. Transabdominal technique was performed for global imaging of the pelvis including uterus, ovaries, adnexal regions, and pelvic cul-de-sac. It was necessary to proceed with endovaginal exam following the transabdominal exam to visualize the endometrium and ovaries.  COMPARISON:  None  FINDINGS: Uterus  Measurements: 10.8 x 6.4 x 5.3 cm = volume: 191 mL. Anteverted. Suboptimally visualized on transvaginal imaging. No definite uterine mass  Endometrium  Thickness: 5 mm.  No endometrial fluid or focal abnormality  Right ovary  Measurements: 1.9 x 1.5 x 1.7 cm = volume: 2.5 mL. Suboptimally visualized. No gross mass.  Left ovary  Measurements: 1.9 x 2.7 x 1.6 cm =  volume: 2.7 mL. Suboptimally visualized, no gross mass.  Other findings  No free pelvic fluid or adnexal masses.  IMPRESSION: No pelvic sonographic abnormalities identified.   Electronically Signed   By: February M.D.   On: 03/02/2020 16:20   MEDS ordered this encounter: No orders of the defined types were placed in this encounter.   Orders for this encounter: No orders of the defined types were placed in this encounter.   Impression:   ICD-10-CM   1. Menorrhagia with irregular cycle: Chronic + stable  N92.1    spotting on megestrol  2. Dysmenorrhea: chronic + stable  N94.6    continue megestrol     Plan: We discussed options for management today specifically intrauterine device ongoing megestrol therapy other progesterone only methods She has been on birth control pills in the past in combination oral contraceptives is not a option at this point We also discussed endometrial ablation Patient understands that the vast majority of patients do well with an ablation however small percentage C unsatisfactory results and require other management which may include an IUD or progesterone only management as well A small percentage also at some point require hysterectomy but unlikely in her with no fibroids at this age  The patient is going to consider her options at this point she is leaning toward an endometrial ablation and preparations will be made for that should she decide to go forward with that  All questions were answered regarding other modes of management as well as surgery and possible complications  I spent a total of 15 minutes of face-to-face and  nonface-to-face time preparing to see the patient, reviewing labs and sonograms, counseling and educating the patient and her husband.  Follow Up: Return in about 8 weeks (around 04/28/2021) for [employer] visit, Post Op, with Dr Despina Hidden.      All questions were answered.  Past Medical History:   Diagnosis Date  . Anxiety   . GERD (gastroesophageal reflux disease)   . IBS (irritable bowel syndrome)   . PONV (postoperative nausea and vomiting)   . Skin disorder    possibly psoriosis, but pt is unsure. Causes red, spotchy, dry spots on body when it gets cold.    Past Surgical History:  Procedure Laterality Date  . CESAREAN SECTION  2006   [facility]-Dr. Ralph Dowdy  . GANGLION CYST EXCISION Right 12/18/2013   Procedure: REMOVAL GANGLION CYST RIGHT FOOT;  Surgeon: Vickki Hearing, MD;  Location: [facility];  Service: Orthopedics;  Laterality: Right;  . TUBAL LIGATION      OB History    Gravida  2   Para  2   Term  2   Preterm      AB      Living  2     SAB      IAB      Ectopic      Multiple      Live Births  2           Allergies  Allergen Reactions  . Penicillins     Social History   Socioeconomic History  . Marital status: Single    Spouse name: Not on file  . Number of children: Not on file  . Years of education: Not on file  . Highest education level: Not on file  Occupational History  . Not on file  Tobacco Use  . Smoking status: Never Smoker  . Smokeless tobacco: Never Used  Vaping Use  . Vaping Use: Never used  Substance and Sexual Activity  . Alcohol use: No  . Drug use: No  . Sexual activity: Yes    Birth control/protection: Surgical    Comment: tubal  Other Topics Concern  . Not on file  Social History Narrative  . Not on file   Social Determinants of Health   Financial Resource Strain: Low Risk   . Difficulty of Paying Living Expenses: Not very hard  Food Insecurity: No Food Insecurity  . Worried About Programme researcher, broadcasting/film/video in the Last Year: Never true  . Ran Out of Food in the Last Year: Never true  Transportation Needs: No Transportation Needs  . Lack of Transportation (Medical): No  . Lack of Transportation (Non-Medical): No  Physical Activity: Inactive  . Days of Exercise per Week: 0 days  . Minutes of Exercise  per Session: 0 min  Stress: No Stress Concern Present  . Feeling of Stress : Not at all  Social Connections: Socially Isolated  . Frequency of Communication with Friends and Family: Three times a week  . Frequency of Social Gatherings with Friends and Family: Once a week  . Attends Religious Services: Never  . Active Member of Clubs or Organizations: No  . Attends Banker Meetings: Never  . Marital Status: Never married    Family History  Problem Relation Age of Onset  . Cancer Father        Lung  . Cancer Paternal Grandfather   . Cancer Paternal Grandmother   . Cancer Maternal Grandmother   . Hypertension Brother   .  Seizures Sister   . Hypertension Brother

## 2021-03-07 ENCOUNTER — Other Ambulatory Visit: Payer: Self-pay | Admitting: Family Medicine

## 2021-03-07 DIAGNOSIS — N3941 Urge incontinence: Secondary | ICD-10-CM

## 2021-03-07 DIAGNOSIS — R3589 Other polyuria: Secondary | ICD-10-CM

## 2021-03-07 DIAGNOSIS — K219 Gastro-esophageal reflux disease without esophagitis: Secondary | ICD-10-CM

## 2021-03-09 ENCOUNTER — Ambulatory Visit: Payer: Medicaid Other | Admitting: Adult Health

## 2021-03-14 ENCOUNTER — Other Ambulatory Visit: Payer: Self-pay | Admitting: Family Medicine

## 2021-03-14 DIAGNOSIS — I10 Essential (primary) hypertension: Secondary | ICD-10-CM

## 2021-03-17 ENCOUNTER — Other Ambulatory Visit: Payer: Self-pay | Admitting: Family Medicine

## 2021-04-07 ENCOUNTER — Other Ambulatory Visit: Payer: Self-pay | Admitting: Family Medicine

## 2021-04-07 ENCOUNTER — Other Ambulatory Visit: Payer: Self-pay | Admitting: Adult Health

## 2021-04-07 DIAGNOSIS — N3941 Urge incontinence: Secondary | ICD-10-CM

## 2021-04-07 DIAGNOSIS — R3589 Other polyuria: Secondary | ICD-10-CM

## 2021-04-10 ENCOUNTER — Other Ambulatory Visit: Payer: Self-pay | Admitting: Family Medicine

## 2021-04-10 DIAGNOSIS — I10 Essential (primary) hypertension: Secondary | ICD-10-CM

## 2021-04-14 DIAGNOSIS — L219 Seborrheic dermatitis, unspecified: Secondary | ICD-10-CM | POA: Diagnosis not present

## 2021-04-18 ENCOUNTER — Ambulatory Visit: Payer: Medicaid Other | Admitting: Podiatry

## 2021-05-09 ENCOUNTER — Telehealth (INDEPENDENT_AMBULATORY_CARE_PROVIDER_SITE_OTHER): Payer: Medicaid Other | Admitting: Obstetrics & Gynecology

## 2021-05-09 ENCOUNTER — Other Ambulatory Visit: Payer: Self-pay | Admitting: Family Medicine

## 2021-05-09 ENCOUNTER — Encounter: Payer: Self-pay | Admitting: Obstetrics & Gynecology

## 2021-05-09 VITALS — Ht 63.0 in

## 2021-05-09 DIAGNOSIS — N946 Dysmenorrhea, unspecified: Secondary | ICD-10-CM | POA: Diagnosis not present

## 2021-05-09 DIAGNOSIS — N921 Excessive and frequent menstruation with irregular cycle: Secondary | ICD-10-CM | POA: Diagnosis not present

## 2021-05-09 DIAGNOSIS — I10 Essential (primary) hypertension: Secondary | ICD-10-CM

## 2021-05-09 NOTE — Progress Notes (Signed)
   TELEHEALTH MyChart video VIRTUAL GYNECOLOGY VISIT ENCOUNTER NOTE  I connected with Alyssa Arellano on 05/09/21 at  9:30 AM EDT by MyChart video chat at home and verified that I am speaking with the correct person using two identifiers.   I discussed the limitations, risks, security and privacy concerns of performing an evaluation and management service by telephone and the availability of in person appointments. I also discussed with the patient that there may be a patient responsible charge related to this service. The patient expressed understanding and agreed to proceed.   History:  Alyssa Arellano is a 45 y.o. G63P2002 female being evaluated today for ongoing menorrhagia and dysmenorrhea. She denies any abnormal vaginal discharge, bleeding, pelvic pain or other concerns.       Past Medical History:  Diagnosis Date  . Anxiety   . GERD (gastroesophageal reflux disease)   . IBS (irritable bowel syndrome)   . PONV (postoperative nausea and vomiting)   . Skin disorder    possibly psoriosis, but pt is unsure. Causes red, spotchy, dry spots on body when it gets cold.   Past Surgical History:  Procedure Laterality Date  . CESAREAN SECTION  2006   [facility]-Dr. Ralph Dowdy  . GANGLION CYST EXCISION Right 12/18/2013   Procedure: REMOVAL GANGLION CYST RIGHT FOOT;  Surgeon: Vickki Hearing, MD;  Location: [facility];  Service: Orthopedics;  Laterality: Right;  . TUBAL LIGATION     The following portions of the patient's history were reviewed and updated as appropriate: allergies, current medications, past family history, past medical history, past social history, past surgical history and problem list.    Review of Systems:  Pertinent items noted in HPI and remainder of comprehensive ROS otherwise negative.  Physical Exam:  Physical exam deferred due to nature of the encounter  Labs and Imaging No results found for this or any previous visit (from the past 336 hour(s)). No results  found.     No orders of the defined types were placed in this encounter.   No orders of the defined types were placed in this encounter.   Assessment and Plan:     ICD-10-CM   1. Menorrhagia with irregular cycle: Chronic + stable  N92.1   2. Dysmenorrhea: chronic + stable  N94.6    Pt once again counseled on options and wants to proceed with endometrial ablation. Will attempt to schedule on 06/28/21      I discussed the assessment and treatment plan with the patient. The patient was provided an opportunity to ask questions and all were answered. The patient agreed with the plan and demonstrated an understanding of the instructions.   The patient was advised to call back or seek an in-person evaluation/go to the ED if the symptoms worsen or if the condition fails to improve as anticipated.  I provided 10 minutes of video non-face-to-face time during this encounter.   Lazaro Arms, MD [facility]

## 2021-05-11 ENCOUNTER — Other Ambulatory Visit: Payer: Self-pay | Admitting: Family Medicine

## 2021-05-11 DIAGNOSIS — K580 Irritable bowel syndrome with diarrhea: Secondary | ICD-10-CM

## 2021-05-23 ENCOUNTER — Encounter: Payer: Self-pay | Admitting: Family Medicine

## 2021-05-23 ENCOUNTER — Other Ambulatory Visit: Payer: Self-pay

## 2021-05-23 ENCOUNTER — Ambulatory Visit: Payer: Medicaid Other | Admitting: Family Medicine

## 2021-05-23 VITALS — BP 124/86 | HR 66 | Temp 97.9°F | Ht 63.0 in | Wt 198.6 lb

## 2021-05-23 DIAGNOSIS — K219 Gastro-esophageal reflux disease without esophagitis: Secondary | ICD-10-CM

## 2021-05-23 DIAGNOSIS — R3589 Other polyuria: Secondary | ICD-10-CM

## 2021-05-23 DIAGNOSIS — N3941 Urge incontinence: Secondary | ICD-10-CM | POA: Diagnosis not present

## 2021-05-23 DIAGNOSIS — N921 Excessive and frequent menstruation with irregular cycle: Secondary | ICD-10-CM

## 2021-05-23 DIAGNOSIS — K582 Mixed irritable bowel syndrome: Secondary | ICD-10-CM | POA: Diagnosis not present

## 2021-05-23 DIAGNOSIS — I10 Essential (primary) hypertension: Secondary | ICD-10-CM | POA: Diagnosis not present

## 2021-05-23 DIAGNOSIS — N920 Excessive and frequent menstruation with regular cycle: Secondary | ICD-10-CM | POA: Diagnosis not present

## 2021-05-23 LAB — CMP14+EGFR
ALT: 18 IU/L (ref 0–32)
AST: 13 IU/L (ref 0–40)
Albumin/Globulin Ratio: 1.8 (ref 1.2–2.2)
Albumin: 4.8 g/dL (ref 3.8–4.8)
Alkaline Phosphatase: 77 IU/L (ref 44–121)
BUN/Creatinine Ratio: 11 (ref 9–23)
BUN: 11 mg/dL (ref 6–24)
Bilirubin Total: 0.5 mg/dL (ref 0.0–1.2)
CO2: 25 mmol/L (ref 20–29)
Calcium: 9.7 mg/dL (ref 8.7–10.2)
Chloride: 102 mmol/L (ref 96–106)
Creatinine, Ser: 0.99 mg/dL (ref 0.57–1.00)
Globulin, Total: 2.7 g/dL (ref 1.5–4.5)
Glucose: 100 mg/dL — ABNORMAL HIGH (ref 65–99)
Potassium: 3.7 mmol/L (ref 3.5–5.2)
Sodium: 142 mmol/L (ref 134–144)
Total Protein: 7.5 g/dL (ref 6.0–8.5)
eGFR: 73 mL/min/{1.73_m2} (ref >59)

## 2021-05-23 LAB — CBC WITH DIFFERENTIAL/PLATELET
Basophils Absolute: 0 10*3/uL (ref 0.0–0.2)
Basos: 1 %
EOS (ABSOLUTE): 0.2 10*3/uL (ref 0.0–0.4)
Eos: 3 %
Hematocrit: 45.7 % (ref 34.0–46.6)
Hemoglobin: 14.8 g/dL (ref 11.1–15.9)
Immature Grans (Abs): 0 10*3/uL (ref 0.0–0.1)
Immature Granulocytes: 0 %
Lymphocytes Absolute: 2.1 10*3/uL (ref 0.7–3.1)
Lymphs: 33 %
MCH: 28.8 pg (ref 26.6–33.0)
MCHC: 32.4 g/dL (ref 31.5–35.7)
MCV: 89 fL (ref 79–97)
Monocytes Absolute: 0.5 10*3/uL (ref 0.1–0.9)
Monocytes: 7 %
Neutrophils Absolute: 3.5 10*3/uL (ref 1.4–7.0)
Neutrophils: 56 %
Platelets: 248 10*3/uL (ref 150–450)
RBC: 5.13 x10E6/uL (ref 3.77–5.28)
RDW: 13.3 % (ref 11.7–15.4)
WBC: 6.3 10*3/uL (ref 3.4–10.8)

## 2021-05-23 MED ORDER — HYDROCHLOROTHIAZIDE 25 MG PO TABS: 1.0000 | ORAL_TABLET | Freq: Every day | ORAL | 5 refills | Status: DC

## 2021-05-23 MED ORDER — SOLIFENACIN SUCCINATE 10 MG PO TABS: 10.0000 mg | ORAL_TABLET | Freq: Every day | ORAL | 1 refills | Status: DC

## 2021-05-23 MED ORDER — OMEPRAZOLE 40 MG PO CPDR: 40.0000 mg | DELAYED_RELEASE_CAPSULE | Freq: Two times a day (BID) | ORAL | 1 refills | Status: DC

## 2021-05-23 MED ORDER — DICYCLOMINE HCL 20 MG PO TABS: ORAL_TABLET | ORAL | 5 refills | Status: DC

## 2021-05-23 MED ORDER — FLUTICASONE PROPIONATE 50 MCG/ACT NA SUSP: NASAL | 6 refills | Status: DC

## 2021-05-23 MED ORDER — LINACLOTIDE 145 MCG PO CAPS: 145.0000 ug | ORAL_CAPSULE | Freq: Every day | ORAL | 5 refills | Status: DC

## 2021-05-23 MED ORDER — VITAMIN D (ERGOCALCIFEROL) 1.25 MG (50000 UNIT) PO CAPS: 50000.0000 [IU] | ORAL_CAPSULE | ORAL | 1 refills | Status: DC

## 2021-05-23 NOTE — Progress Notes (Signed)
Subjective:  Patient ID: Alyssa Arellano, female    DOB: 1976/09/20  Age: 45 y.o. MRN: [id number]  CC: Medical Management of Chronic Issues   HPI Alyssa Arellano presents for sharp pain and cramps in abd. Not adequately relieved. Constipation occurs some relief with stool softener. When stomach acts up and will have multiple runny BMs.   Getting Minerva procedure on July 20 with Dr. Elonda Husky, due to heavy menses, irregular with cramping  Also feels vesicare is working well for the urinary stress incontinence.    presents for  follow-up of hypertension. Patient has no history of headache chest pain or shortness of breath or recent cough. Patient also denies symptoms of TIA such as focal numbness or weakness. Patient denies side effects from medication. States taking it regularly.     Depression screen [facility] 2/9 05/23/2021 12/27/2020 11/22/2020  Decreased Interest 0 0 0  Down, Depressed, Hopeless 0 0 0  PHQ - 2 Score 0 0 0  Altered sleeping - 0 -  Tired, decreased energy - 0 -  Change in appetite - 0 -  Feeling bad or failure about yourself  - 0 -  Trouble concentrating - 0 -  Moving slowly or fidgety/restless - 0 -  Suicidal thoughts - 0 -  PHQ-9 Score - 0 -    History Gennell has a past medical history of Anxiety, GERD (gastroesophageal reflux disease), IBS (irritable bowel syndrome), PONV (postoperative nausea and vomiting), and Skin disorder.   Alyssa Arellano has a past surgical history that includes Cesarean section (2006); Ganglion cyst excision (Right, 12/18/2013); and Tubal ligation.   Alyssa Arellano family history includes Cancer in Alyssa Arellano father, maternal grandmother, paternal grandfather, and paternal grandmother; Hypertension in Alyssa Arellano brother and brother; Seizures in Alyssa Arellano sister.Alyssa Arellano reports that Alyssa Arellano has never smoked. Alyssa Arellano has never used smokeless tobacco. Alyssa Arellano reports that Alyssa Arellano does not drink alcohol and does not use drugs.    ROS Review of Systems  Constitutional: Negative.   HENT: Negative.     Eyes:  Negative for visual disturbance.  Respiratory:  Negative for shortness of breath.   Cardiovascular:  Negative for chest pain.  Gastrointestinal:  Negative for abdominal pain.  Genitourinary:  Positive for menstrual problem (irregular, heavy).  Musculoskeletal:  Negative for arthralgias.   Objective:  BP 124/86   Pulse 66   Temp 97.9 F (36.6 C)   Ht 5' 3" (1.6 m)   Wt 198 lb 9.6 oz (90.1 kg)   SpO2 100%   BMI 35.18 kg/m   BP Readings from Last 3 Encounters:  05/23/21 124/86  03/06/21 121/81  02/21/21 121/84    Wt Readings from Last 3 Encounters:  05/23/21 198 lb 9.6 oz (90.1 kg)  03/06/21 202 lb (91.6 kg)  02/21/21 206 lb (93.4 kg)     Physical Exam Constitutional:      General: Alyssa Arellano is not in acute distress.    Appearance: Alyssa Arellano is well-developed.  HENT:     Head: Normocephalic and atraumatic.  Eyes:     Conjunctiva/sclera: Conjunctivae normal.     Pupils: Pupils are equal, round, and reactive to light.  Neck:     Thyroid: No thyromegaly.  Cardiovascular:     Rate and Rhythm: Normal rate and regular rhythm.     Heart sounds: Normal heart sounds. No murmur heard. Pulmonary:     Effort: Pulmonary effort is normal. No respiratory distress.     Breath sounds: Normal breath sounds. No wheezing or rales.  Abdominal:  Palpations: Abdomen is soft. There is no mass.     Tenderness: no abdominal tenderness  Musculoskeletal:        General: Normal range of motion.     Cervical back: Normal range of motion and neck supple.  Lymphadenopathy:     Cervical: No cervical adenopathy.  Skin:    General: Skin is warm and dry.  Neurological:     Mental Status: Alyssa Arellano is alert and oriented to person, place, and time.  Psychiatric:        Behavior: Behavior normal.      Assessment & Plan:   Alyssa Arellano was seen today for medical management of chronic issues.  Diagnoses and all orders for this visit:  Irritable bowel syndrome with both constipation and diarrhea -      dicyclomine (BENTYL) 20 MG tablet; TAKE 1 TABLET BY MOUTH 4 TIMES DAILY - BEFORE MEALS AND AT BEDTIME. -     CBC with Differential/Platelet -     CMP14+EGFR  Menorrhagia with regular cycle -     CBC with Differential/Platelet -     CMP14+EGFR  Essential hypertension -     hydrochlorothiazide (HYDRODIURIL) 25 MG tablet; Take 1 tablet (25 mg total) by mouth daily. -     CBC with Differential/Platelet -     CMP14+EGFR  Gastroesophageal reflux disease without esophagitis -     omeprazole (PRILOSEC) 40 MG capsule; Take 1 capsule (40 mg total) by mouth 2 (two) times daily. On an empty stomach -     CBC with Differential/Platelet -     CMP14+EGFR  Polyuria -     solifenacin (VESICARE) 10 MG tablet; Take 1 tablet (10 mg total) by mouth daily. For bladder control -     CBC with Differential/Platelet -     CMP14+EGFR  Urge incontinence of urine -     solifenacin (VESICARE) 10 MG tablet; Take 1 tablet (10 mg total) by mouth daily. For bladder control -     CBC with Differential/Platelet -     CMP14+EGFR  Menorrhagia with irregular cycle -     CBC with Differential/Platelet -     CMP14+EGFR  Other orders -     fluticasone (FLONASE) 50 MCG/ACT nasal spray; SPRAY 2 SPRAYS INTO EACH NOSTRIL EVERY DAY -     Vitamin D, Ergocalciferol, (DRISDOL) 1.25 MG (50000 UNIT) CAPS capsule; Take 1 capsule (50,000 Units total) by mouth every 7 (seven) days. -     linaclotide (LINZESS) 145 MCG CAPS capsule; Take 1 capsule (145 mcg total) by mouth daily. To regulate bowel movements      I am having Alyssa Arellano start on linaclotide. I am also having Alyssa Arellano maintain Alyssa Arellano calcium carbonate, ferrous sulfate, ibuprofen, hydrocortisone, megestrol, fluticasone, hydrochlorothiazide, dicyclomine, omeprazole, solifenacin, and Vitamin D (Ergocalciferol).  Allergies as of 05/23/2021       Reactions   Penicillins         Medication List        Accurate as of May 23, 2021  9:15 AM. If you have any  questions, ask your nurse or doctor.          calcium carbonate 750 MG chewable tablet Commonly known as: TUMS EX Chew 1 tablet by mouth as needed for heartburn.   dicyclomine 20 MG tablet Commonly known as: BENTYL TAKE 1 TABLET BY MOUTH 4 TIMES DAILY - BEFORE MEALS AND AT BEDTIME.   ferrous sulfate 325 (65 FE) MG EC tablet Take 325 mg by mouth in  the morning and at bedtime.   fluticasone 50 MCG/ACT nasal spray Commonly known as: FLONASE SPRAY 2 SPRAYS INTO EACH NOSTRIL EVERY DAY   hydrochlorothiazide 25 MG tablet Commonly known as: HYDRODIURIL Take 1 tablet (25 mg total) by mouth daily.   hydrocortisone 2.5 % cream Apply topically 2 (two) times daily.   ibuprofen 800 MG tablet Commonly known as: ADVIL Take 1 tablet (800 mg total) by mouth every 8 (eight) hours as needed.   linaclotide 145 MCG Caps capsule Commonly known as: Linzess Take 1 capsule (145 mcg total) by mouth daily. To regulate bowel movements Started by: Claretta Fraise, MD   megestrol 40 MG tablet Commonly known as: MEGACE Takes 1 daily   omeprazole 40 MG capsule Commonly known as: PRILOSEC Take 1 capsule (40 mg total) by mouth 2 (two) times daily. On an empty stomach   solifenacin 10 MG tablet Commonly known as: VESICARE Take 1 tablet (10 mg total) by mouth daily. For bladder control   Vitamin D (Ergocalciferol) 1.25 MG (50000 UNIT) Caps capsule Commonly known as: DRISDOL Take 1 capsule (50,000 Units total) by mouth every 7 (seven) days.         Follow-up: Return in about 6 months (around 11/22/2021).  Claretta Fraise, M.D.

## 2021-05-24 NOTE — Progress Notes (Signed)
Hello Alyssa Arellano, ° °Your lab result is normal and/or stable.Some minor variations that are not significant are commonly marked abnormal, but do not represent any medical problem for you. ° °Best regards, °Standley Bargo, M.D.

## 2021-06-20 NOTE — Patient Instructions (Signed)
Alyssa Arellano  06/20/2021     @   Your procedure is scheduled on  06/28/2021.   Report to [facility] at  [phone] A.M.   Call this number if you have problems the morning of surgery:  [phone]   Remember:  Do not eat after midnight.   You may drink clear liquids until  0640 AM .  Clear liquids allowed are:                    Water, Juice (non-citric and without pulp - diabetics please choose diet or no sugar options), Carbonated beverages - (diabetics please choose diet or no sugar options), Clear Tea, Black Coffee only (no creamer, milk or cream including half and half), Plain Jell-O only (diabetics please choose diet or no sugar options), Gatorade (diabetics please choose diet or no sugar options), and Plain Popsicles only  Drink your carb drink at 0640 and have nothing else to drink after this.    Take these medicines the morning of surgery with A SIP OF WATER       mobic (if needed), prilosec, vesicare.      Do not wear jewelry, make-up or nail polish.  Do not wear lotions, powders, or perfumes, or deodorant.  Do not shave 48 hours prior to surgery.  Men may shave face and neck.  Do not bring valuables to the hospital.  [facility] is not responsible for any belongings or valuables.  Contacts, dentures or bridgework may not be worn into surgery.  Leave your suitcase in the car.  After surgery it may be brought to your room.  For patients admitted to the hospital, discharge time will be determined by your treatment team.  Patients discharged the day of surgery will not be allowed to drive home and must have someone with them for 24 hours.    Special instructions:     DO NOT smoke tobacco or vape for 24 hours before your procedure.  Please read over the following fact sheets that you were given. Coughing and Deep Breathing, Surgical Site Infection Prevention, Anesthesia Post-op Instructions, and Care and Recovery After  Surgery      Dilation and Curettage or Vacuum Curettage, Care After The following information offers guidance on how to care for yourself after your procedure. Your doctor may also give you more specific instructions. Ifyou have problems or questions, contact your doctor. What can I expect after the procedure? After the procedure, it is common to have: Mild pain or cramps. Some bleeding or spotting from the vagina. These may last for up to 2 weeks. Follow these instructions at home: Medicines Take over-the-counter and prescription medicines only as told by your doctor. If told, take steps to prevent problems with pooping (constipation). You may need to: Drink enough fluid to keep your pee (urine) pale yellow. Take medicines. You will be told what medicines to take. Eat foods that are high in fiber. These include beans, whole grains, and fresh fruits and vegetables. Limit foods that are high in fat and sugar. These include fried or sweet foods. Ask your doctor if you should avoid driving or using machines while you are taking your medicine. Activity  If you were given a medicine to help you relax (sedative) during your procedure, it can affect you for many hours. Do not drive or use machinery until your doctor says that it is safe. Rest as told by your doctor. Get  up to take short walks every 1-2 hours. Ask for help if you feel weak or unsteady. Do not lift anything that is heavier than 10 lb (4.5 kg), or the limit that you are told. Return to your normal activities when your doctor says that it is safe.  Lifestyle For at least 2 weeks, or as long as told by your doctor: Do not douche. Do not use tampons. Do not have sex. General instructions Do not take baths, swim, or use a hot tub. Ask your doctor if you may take showers. Do not smoke or use any products that contain nicotine or tobacco. These can delay healing. If you need help quitting, ask your doctor. Wear compression  stockings as told by your doctor. It is up to you to get the results of your procedure. Ask how to get your results when they are ready. Keep all follow-up visits. Contact a doctor if: You have very bad cramps that get worse or do not get better with medicine. You have very bad pain in your belly (abdomen). You cannot drink fluids without vomiting. You have pain in the area just above your thighs. You have fluid from your vagina that smells bad. You have a rash. Get help right away if: You are bleeding a lot from your vagina. This means soaking more than one sanitary pad in 1 hour, and this happens for 2 hours in a row. You have a fever that is above 100.62F (38C). Your belly feels very tender or hard. You have chest pain. You have trouble breathing. You feel dizzy or light-headed. You faint. You have pain in your neck or shoulder area. These symptoms may be an emergency. Get help right away. Call your local emergency services (911 in the [place] Do not wait to see if the symptoms will go away. Do not drive yourself to the hospital. Summary After your procedure, it is common to have pain or cramping. It is also common to have bleeding or spotting from your vagina. Rest as told. Get up to take short walks every 1-2 hours. Do not lift anything that is heavier than 10 lb (4.5 kg), or the limit that you are told. Get help right away if you have problems from the procedure. Ask your doctor what problems to watch for. This information is not intended to replace advice given to you by your health care provider. Make sure you discuss any questions you have with your healthcare provider. Document Revised: 11/16/2020 Document Reviewed: 11/16/2020 Elsevier Patient Education  2022 [employer] General Anesthesia, Adult, Care After This sheet gives you information about how to care for yourself after your procedure. Your health care provider may also give you more specific instructions. If you  have problems or questions, contact your health careprovider. What can I expect after the procedure? After the procedure, the following side effects are common: Pain or discomfort at the IV site. Nausea. Vomiting. Sore throat. Trouble concentrating. Feeling cold or chills. Feeling weak or tired. Sleepiness and fatigue. Soreness and body aches. These side effects can affect parts of the body that were not involved in surgery. Follow these instructions at home: For the time period you were told by your health care provider:  Rest. Do not participate in activities where you could fall or become injured. Do not drive or use machinery. Do not drink alcohol. Do not take sleeping pills or medicines that cause drowsiness. Do not make important decisions or sign legal documents. Do not take care  of children on your own.  Eating and drinking Follow any instructions from your health care provider about eating or drinking restrictions. When you feel hungry, start by eating small amounts of foods that are soft and easy to digest (bland), such as toast. Gradually return to your regular diet. Drink enough fluid to keep your urine pale yellow. If you vomit, rehydrate by drinking water, juice, or clear broth. General instructions If you have sleep apnea, surgery and certain medicines can increase your risk for breathing problems. Follow instructions from your health care provider about wearing your sleep device: Anytime you are sleeping, including during daytime naps. While taking prescription pain medicines, sleeping medicines, or medicines that make you drowsy. Have a responsible adult stay with you for the time you are told. It is important to have someone help care for you until you are awake and alert. Return to your normal activities as told by your health care provider. Ask your health care provider what activities are safe for you. Take over-the-counter and prescription medicines only as  told by your health care provider. If you smoke, do not smoke without supervision. Keep all follow-up visits as told by your health care provider. This is important. Contact a health care provider if: You have nausea or vomiting that does not get better with medicine. You cannot eat or drink without vomiting. You have pain that does not get better with medicine. You are unable to pass urine. You develop a skin rash. You have a fever. You have redness around your IV site that gets worse. Get help right away if: You have difficulty breathing. You have chest pain. You have blood in your urine or stool, or you vomit blood. Summary After the procedure, it is common to have a sore throat or nausea. It is also common to feel tired. Have a responsible adult stay with you for the time you are told. It is important to have someone help care for you until you are awake and alert. When you feel hungry, start by eating small amounts of foods that are soft and easy to digest (bland), such as toast. Gradually return to your regular diet. Drink enough fluid to keep your urine pale yellow. Return to your normal activities as told by your health care provider. Ask your health care provider what activities are safe for you. This information is not intended to replace advice given to you by your health care provider. Make sure you discuss any questions you have with your healthcare provider. Document Revised: 08/11/2020 Document Reviewed: 03/10/2020 Elsevier Patient Education  2022 [employer] How to Use Chlorhexidine for Bathing Chlorhexidine gluconate (CHG) is a germ-killing (antiseptic) solution that is used to clean the skin. It can get rid of the bacteria that normally live on the skin and can keep them away for about 24 hours. To clean your skin with CHG, you may be given: A CHG solution to use in the shower or as part of a sponge bath. A prepackaged cloth that contains CHG. Cleaning your skin with  CHG may help lower the risk for infection: While you are staying in the intensive care unit of the hospital. If you have a vascular access, such as a central line, to provide short-term or long-term access to your veins. If you have a catheter to drain urine from your bladder. If you are on a ventilator. A ventilator is a machine that helps you breathe by moving air in and out of your lungs. After  surgery. What are the risks? Risks of using CHG include: A skin reaction. Hearing loss, if CHG gets in your ears. Eye injury, if CHG gets in your eyes and is not rinsed out. The CHG product catching fire. Make sure that you avoid smoking and flames after applying CHG to your skin. Do not use CHG: If you have a chlorhexidine allergy or have previously reacted to chlorhexidine. On babies younger than 31 months of age. How to use CHG solution Use CHG only as told by your health care provider, and follow the instructions on the label. Use the full amount of CHG as directed. Usually, this is one bottle. During a shower Follow these steps when using CHG solution during a shower (unless your health care provider gives you different instructions): Start the shower. Use your normal soap and shampoo to wash your face and hair. Turn off the shower or move out of the shower stream. Pour the CHG onto a clean washcloth. Do not use any type of brush or rough-edged sponge. Starting at your neck, lather your body down to your toes. Make sure you follow these instructions: If you will be having surgery, pay special attention to the part of your body where you will be having surgery. Scrub this area for at least 1 minute. Do not use CHG on your head or face. If the solution gets into your ears or eyes, rinse them well with water. Avoid your genital area. Avoid any areas of skin that have broken skin, cuts, or scrapes. Scrub your back and under your arms. Make sure to wash skin folds. Let the lather sit on your  skin for 1-2 minutes or as long as told by your health care provider. Thoroughly rinse your entire body in the shower. Make sure that all body creases and crevices are rinsed well. Dry off with a clean towel. Do not put any substances on your body afterward--such as powder, lotion, or perfume--unless you are told to do so by your health care provider. Only use lotions that are recommended by the manufacturer. Put on clean clothes or pajamas. If it is the night before your surgery, sleep in clean sheets.  During a sponge bath Follow these steps when using CHG solution during a sponge bath (unless your health care provider gives you different instructions): Use your normal soap and shampoo to wash your face and hair. Pour the CHG onto a clean washcloth. Starting at your neck, lather your body down to your toes. Make sure you follow these instructions: If you will be having surgery, pay special attention to the part of your body where you will be having surgery. Scrub this area for at least 1 minute. Do not use CHG on your head or face. If the solution gets into your ears or eyes, rinse them well with water. Avoid your genital area. Avoid any areas of skin that have broken skin, cuts, or scrapes. Scrub your back and under your arms. Make sure to wash skin folds. Let the lather sit on your skin for 1-2 minutes or as long as told by your health care provider. Using a different clean, wet washcloth, thoroughly rinse your entire body. Make sure that all body creases and crevices are rinsed well. Dry off with a clean towel. Do not put any substances on your body afterward--such as powder, lotion, or perfume--unless you are told to do so by your health care provider. Only use lotions that are recommended by the manufacturer. Put on clean  clothes or pajamas. If it is the night before your surgery, sleep in clean sheets. How to use CHG prepackaged cloths Only use CHG cloths as told by your health care  provider, and follow the instructions on the label. Use the CHG cloth on clean, dry skin. Do not use the CHG cloth on your head or face unless your health care provider tells you to. When washing with the CHG cloth: Avoid your genital area. Avoid any areas of skin that have broken skin, cuts, or scrapes. Before surgery Follow these steps when using a CHG cloth to clean before surgery (unless your health care provider gives you different instructions): Using the CHG cloth, vigorously scrub the part of your body where you will be having surgery. Scrub using a back-and-forth motion for 3 minutes. The area on your body should be completely wet with CHG when you are done scrubbing. Do not rinse. Discard the cloth and let the area air-dry. Do not put any substances on the area afterward, such as powder, lotion, or perfume. Put on clean clothes or pajamas. If it is the night before your surgery, sleep in clean sheets.  For general bathing Follow these steps when using CHG cloths for general bathing (unless your health care provider gives you different instructions). Use a separate CHG cloth for each area of your body. Make sure you wash between any folds of skin and between your fingers and toes. Wash your body in the following order, switching to a new cloth after each step: The front of your neck, shoulders, and chest. Both of your arms, under your arms, and your hands. Your stomach and groin area, avoiding the genitals. Your right leg and foot. Your left leg and foot. The back of your neck, your back, and your buttocks. Do not rinse. Discard the cloth and let the area air-dry. Do not put any substances on your body afterward--such as powder, lotion, or perfume--unless you are told to do so by your health care provider. Only use lotions that are recommended by the manufacturer. Put on clean clothes or pajamas. Contact a health care provider if: Your skin gets irritated after scrubbing. You have  questions about using your solution or cloth. Get help right away if: Your eyes become very red or swollen. Your eyes itch badly. Your skin itches badly and is red or swollen. Your hearing changes. You have trouble seeing. You have swelling or tingling in your mouth or throat. You have trouble breathing. You swallow any chlorhexidine. Summary Chlorhexidine gluconate (CHG) is a germ-killing (antiseptic) solution that is used to clean the skin. Cleaning your skin with CHG may help to lower your risk for infection. You may be given CHG to use for bathing. It may be in a bottle or in a prepackaged cloth to use on your skin. Carefully follow your health care provider's instructions and the instructions on the product label. Do not use CHG if you have a chlorhexidine allergy. Contact your health care provider if your skin gets irritated after scrubbing. This information is not intended to replace advice given to you by your health care provider. Make sure you discuss any questions you have with your healthcare provider. Document Revised: 04/08/2020 Document Reviewed: 05/13/2020 Elsevier Patient Education  2022 [employer].

## 2021-06-22 ENCOUNTER — Encounter (HOSPITAL_COMMUNITY)
Admission: RE | Admit: 2021-06-22 | Discharge: 2021-06-22 | Disposition: A | Discharge status: Home / Self Care | Payer: Medicaid Other | Source: Ambulatory Visit | Attending: Obstetrics & Gynecology | Admitting: Obstetrics & Gynecology

## 2021-06-22 ENCOUNTER — Other Ambulatory Visit: Payer: Self-pay | Admitting: Obstetrics & Gynecology

## 2021-06-22 ENCOUNTER — Other Ambulatory Visit: Payer: Self-pay

## 2021-06-22 DIAGNOSIS — Z01812 Encounter for preprocedural laboratory examination: Secondary | ICD-10-CM | POA: Insufficient documentation

## 2021-06-22 LAB — COMPREHENSIVE METABOLIC PANEL
ALT: 20 U/L (ref 0–44)
AST: 14 U/L — ABNORMAL LOW (ref 15–41)
Albumin: 4.1 g/dL (ref 3.5–5.0)
Alkaline Phosphatase: 61 U/L (ref 38–126)
Anion gap: 6 (ref 5–15)
BUN: 13 mg/dL (ref 6–20)
CO2: 26 mmol/L (ref 22–32)
Calcium: 9 mg/dL (ref 8.9–10.3)
Chloride: 107 mmol/L (ref 98–111)
Creatinine, Ser: 0.88 mg/dL (ref 0.44–1.00)
GFR, Estimated: >60 mL/min (ref >60)
Glucose, Bld: 100 mg/dL — ABNORMAL HIGH (ref 70–99)
Potassium: 3.5 mmol/L (ref 3.5–5.1)
Sodium: 139 mmol/L (ref 135–145)
Total Bilirubin: 0.8 mg/dL (ref 0.3–1.2)
Total Protein: 7.4 g/dL (ref 6.5–8.1)

## 2021-06-22 LAB — CBC
HCT: 43.7 % (ref 36.0–46.0)
Hemoglobin: 14.5 g/dL (ref 12.0–15.0)
MCH: 29.4 pg (ref 26.0–34.0)
MCHC: 33.2 g/dL (ref 30.0–36.0)
MCV: 88.6 fL (ref 80.0–100.0)
Platelets: 210 10*3/uL (ref 150–400)
RBC: 4.93 MIL/uL (ref 3.87–5.11)
RDW: 13.7 % (ref 11.5–15.5)
WBC: 6.6 10*3/uL (ref 4.0–10.5)
nRBC: 0 % (ref 0.0–0.2)

## 2021-06-22 LAB — RAPID HIV SCREEN (HIV 1/2 AB+AG)
HIV 1/2 Antibodies: NONREACTIVE
HIV-1 P24 Antigen - HIV24: NONREACTIVE

## 2021-06-22 LAB — URINALYSIS, ROUTINE W REFLEX MICROSCOPIC
Bilirubin Urine: NEGATIVE
Glucose, UA: NEGATIVE mg/dL
Ketones, ur: NEGATIVE mg/dL
Leukocytes,Ua: NEGATIVE
Nitrite: NEGATIVE
Protein, ur: NEGATIVE mg/dL
Specific Gravity, Urine: 1.015 (ref 1.005–1.030)
pH: 5 (ref 5.0–8.0)

## 2021-06-22 LAB — HCG, QUANTITATIVE, PREGNANCY: hCG, Beta Chain, Quant, S: <1 m[IU]/mL (ref <5)

## 2021-06-28 ENCOUNTER — Encounter (HOSPITAL_COMMUNITY): Payer: Self-pay | Admitting: Obstetrics & Gynecology

## 2021-06-28 ENCOUNTER — Ambulatory Visit (HOSPITAL_COMMUNITY): Payer: Medicaid Other | Admitting: Anesthesiology

## 2021-06-28 ENCOUNTER — Other Ambulatory Visit: Payer: Self-pay

## 2021-06-28 ENCOUNTER — Ambulatory Visit (HOSPITAL_COMMUNITY)
Admission: RE | Admit: 2021-06-28 | Discharge: 2021-06-28 | Disposition: A | Discharge status: Home / Self Care | Payer: Medicaid Other | Attending: Obstetrics & Gynecology | Admitting: Obstetrics & Gynecology

## 2021-06-28 ENCOUNTER — Encounter (HOSPITAL_COMMUNITY)
Admission: RE | Disposition: A | Discharge status: Home / Self Care | Payer: Self-pay | Source: Home / Self Care | Attending: Obstetrics & Gynecology

## 2021-06-28 ENCOUNTER — Other Ambulatory Visit: Payer: Self-pay | Admitting: Obstetrics & Gynecology

## 2021-06-28 DIAGNOSIS — Z791 Long term (current) use of non-steroidal anti-inflammatories (NSAID): Secondary | ICD-10-CM | POA: Diagnosis not present

## 2021-06-28 DIAGNOSIS — Z79899 Other long term (current) drug therapy: Secondary | ICD-10-CM | POA: Insufficient documentation

## 2021-06-28 DIAGNOSIS — N921 Excessive and frequent menstruation with irregular cycle: Secondary | ICD-10-CM | POA: Diagnosis not present

## 2021-06-28 DIAGNOSIS — N946 Dysmenorrhea, unspecified: Secondary | ICD-10-CM | POA: Insufficient documentation

## 2021-06-28 DIAGNOSIS — Z79818 Long term (current) use of other agents affecting estrogen receptors and estrogen levels: Secondary | ICD-10-CM | POA: Diagnosis not present

## 2021-06-28 DIAGNOSIS — Z880 Allergy status to penicillin: Secondary | ICD-10-CM | POA: Diagnosis not present

## 2021-06-28 HISTORY — PX: DILATATION AND CURETTAGE/HYSTEROSCOPY WITH MINERVA: SHX6851

## 2021-06-28 SURGERY — DILATATION AND CURETTAGE/HYSTEROSCOPY WITH MINERVA
Anesthesia: General | Site: Vagina

## 2021-06-28 MED ORDER — ONDANSETRON 8 MG PO TBDP: 8.0000 mg | ORAL_TABLET | Freq: Three times a day (TID) | ORAL | 0 refills | Status: DC | PRN

## 2021-06-28 MED ORDER — 0.9 % SODIUM CHLORIDE (POUR BTL) OPTIME
TOPICAL | Status: DC | PRN
  Administered 2021-06-28: 1000 mL

## 2021-06-28 MED ORDER — ONDANSETRON HCL 4 MG/2ML IJ SOLN: 4.0000 mg | Freq: Once | INTRAMUSCULAR | Status: DC | PRN

## 2021-06-28 MED ORDER — DEXAMETHASONE SODIUM PHOSPHATE 4 MG/ML IJ SOLN
INTRAMUSCULAR | Status: DC | PRN
  Administered 2021-06-28: 5 mg via INTRAVENOUS

## 2021-06-28 MED ORDER — FENTANYL CITRATE (PF) 100 MCG/2ML IJ SOLN
INTRAMUSCULAR | Status: DC | PRN
  Administered 2021-06-28: 50 ug via INTRAVENOUS

## 2021-06-28 MED ORDER — MIDAZOLAM HCL 2 MG/2ML IJ SOLN
INTRAMUSCULAR | Status: AC
  Filled 2021-06-28: qty 2

## 2021-06-28 MED ORDER — KETOROLAC TROMETHAMINE 10 MG PO TABS: 10.0000 mg | ORAL_TABLET | Freq: Three times a day (TID) | ORAL | 0 refills | Status: DC | PRN

## 2021-06-28 MED ORDER — CEFAZOLIN SODIUM-DEXTROSE 2-4 GM/100ML-% IV SOLN
INTRAVENOUS | Status: AC
  Filled 2021-06-28: qty 100

## 2021-06-28 MED ORDER — KETOROLAC TROMETHAMINE 30 MG/ML IJ SOLN
INTRAMUSCULAR | Status: AC
  Filled 2021-06-28: qty 1

## 2021-06-28 MED ORDER — CHLORHEXIDINE GLUCONATE 0.12 % MT SOLN
15.0000 mL | Freq: Once | OROMUCOSAL | Status: AC
  Administered 2021-06-28: 15 mL via OROMUCOSAL

## 2021-06-28 MED ORDER — HYDROCODONE-ACETAMINOPHEN 5-325 MG PO TABS: 1.0000 | ORAL_TABLET | Freq: Four times a day (QID) | ORAL | 0 refills | Status: DC | PRN

## 2021-06-28 MED ORDER — KETOROLAC TROMETHAMINE 30 MG/ML IJ SOLN
30.0000 mg | Freq: Once | INTRAMUSCULAR | Status: AC
  Administered 2021-06-28: 30 mg via INTRAVENOUS

## 2021-06-28 MED ORDER — LIDOCAINE HCL (PF) 2 % IJ SOLN
INTRAMUSCULAR | Status: AC
  Filled 2021-06-28: qty 5

## 2021-06-28 MED ORDER — DEXAMETHASONE SODIUM PHOSPHATE 10 MG/ML IJ SOLN
INTRAMUSCULAR | Status: AC
  Filled 2021-06-28: qty 1

## 2021-06-28 MED ORDER — MEPERIDINE HCL 50 MG/ML IJ SOLN: 6.2500 mg | INTRAMUSCULAR | Status: DC | PRN

## 2021-06-28 MED ORDER — PROPOFOL 10 MG/ML IV BOLUS
INTRAVENOUS | Status: DC | PRN
  Administered 2021-06-28: 200 mg via INTRAVENOUS

## 2021-06-28 MED ORDER — LACTATED RINGERS IV SOLN: INTRAVENOUS | Status: DC

## 2021-06-28 MED ORDER — FENTANYL CITRATE (PF) 100 MCG/2ML IJ SOLN
INTRAMUSCULAR | Status: AC
  Filled 2021-06-28: qty 2

## 2021-06-28 MED ORDER — SODIUM CHLORIDE 0.9 % IR SOLN
Status: DC | PRN
  Administered 2021-06-28: 3000 mL

## 2021-06-28 MED ORDER — CEFAZOLIN SODIUM-DEXTROSE 2-4 GM/100ML-% IV SOLN
2.0000 g | INTRAVENOUS | Status: AC
  Administered 2021-06-28: 2 g via INTRAVENOUS

## 2021-06-28 MED ORDER — ONDANSETRON HCL 4 MG/2ML IJ SOLN
INTRAMUSCULAR | Status: AC
  Filled 2021-06-28: qty 2

## 2021-06-28 MED ORDER — ORAL CARE MOUTH RINSE: 15.0000 mL | Freq: Once | OROMUCOSAL | Status: AC

## 2021-06-28 MED ORDER — CHLORHEXIDINE GLUCONATE 0.12 % MT SOLN
OROMUCOSAL | Status: AC
  Filled 2021-06-28: qty 15

## 2021-06-28 MED ORDER — POVIDONE-IODINE 10 % EX SWAB: 2.0000 "application " | Freq: Once | CUTANEOUS | Status: DC

## 2021-06-28 MED ORDER — LIDOCAINE HCL (CARDIAC) PF 100 MG/5ML IV SOSY
PREFILLED_SYRINGE | INTRAVENOUS | Status: DC | PRN
  Administered 2021-06-28: 100 mg via INTRAVENOUS

## 2021-06-28 MED ORDER — PROPOFOL 10 MG/ML IV BOLUS
INTRAVENOUS | Status: AC
  Filled 2021-06-28: qty 20

## 2021-06-28 MED ORDER — HYDROMORPHONE HCL 1 MG/ML IJ SOLN: 0.2500 mg | INTRAMUSCULAR | Status: DC | PRN

## 2021-06-28 MED ORDER — SCOPOLAMINE 1 MG/3DAYS TD PT72
1.0000 | MEDICATED_PATCH | TRANSDERMAL | Status: DC
  Administered 2021-06-28: 1.5 mg via TRANSDERMAL
  Filled 2021-06-28: qty 1

## 2021-06-28 MED ORDER — MIDAZOLAM HCL 5 MG/5ML IJ SOLN
INTRAMUSCULAR | Status: DC | PRN
  Administered 2021-06-28: 2 mg via INTRAVENOUS

## 2021-06-28 MED ORDER — ONDANSETRON HCL 4 MG/2ML IJ SOLN
INTRAMUSCULAR | Status: DC | PRN
  Administered 2021-06-28: 4 mg via INTRAVENOUS

## 2021-06-28 SURGICAL SUPPLY — 26 items
BAG HAMPER (MISCELLANEOUS) ×2 IMPLANT
CLOTH BEACON ORANGE TIMEOUT ST (SAFETY) ×2 IMPLANT
COVER LIGHT HANDLE STERIS (MISCELLANEOUS) ×4 IMPLANT
GAUZE 4X4 16PLY ~~LOC~~+RFID DBL (SPONGE) ×4 IMPLANT
GLOVE ECLIPSE 8.0 STRL XLNG CF (GLOVE) ×2 IMPLANT
GLOVE SRG 8 PF TXTR STRL LF DI (GLOVE) ×1 IMPLANT
GLOVE SURG UNDER POLY LF SZ7 (GLOVE) ×4 IMPLANT
GLOVE SURG UNDER POLY LF SZ8 (GLOVE) ×2
GOWN STRL REUS W/TWL LRG LVL3 (GOWN DISPOSABLE) ×2 IMPLANT
GOWN STRL REUS W/TWL XL LVL3 (GOWN DISPOSABLE) ×2 IMPLANT
HANDPIECE ABLA MINERVA ENDO (MISCELLANEOUS) ×2 IMPLANT
INST SET HYSTEROSCOPY (KITS) ×2 IMPLANT
IV NS 1000ML (IV SOLUTION) ×2
IV NS 1000ML BAXH (IV SOLUTION) ×1 IMPLANT
KIT TURNOVER CYSTO (KITS) ×2 IMPLANT
MANIFOLD NEPTUNE II (INSTRUMENTS) ×2 IMPLANT
MARKER SKIN DUAL TIP RULER LAB (MISCELLANEOUS) ×2 IMPLANT
NS IRRIG 1000ML POUR BTL (IV SOLUTION) ×2 IMPLANT
PACK BASIC III (CUSTOM PROCEDURE TRAY) ×2
PACK SRG BSC III STRL LF ECLPS (CUSTOM PROCEDURE TRAY) ×1 IMPLANT
PAD ARMBOARD 7.5X6 YLW CONV (MISCELLANEOUS) ×2 IMPLANT
PAD TELFA 3X4 1S STER (GAUZE/BANDAGES/DRESSINGS) ×2 IMPLANT
SET BASIN LINEN APH (SET/KITS/TRAYS/PACK) ×2 IMPLANT
SET CYSTO W/LG BORE CLAMP LF (SET/KITS/TRAYS/PACK) ×2 IMPLANT
SHEET LAVH (DRAPES) ×2 IMPLANT
TUBE CONNECTING 12X1/4 (SUCTIONS) ×2 IMPLANT

## 2021-06-28 NOTE — Op Note (Signed)
Preoperative diagnosis:  1.   menometrorrhagia                                         2.  dysmenorrhea   Postoperative diagnoses: Same as above   Procedure: Hysteroscopy, uterine curettage, endometrial ablation using Minerva  Surgeon: Lazaro Arms   Anesthesia: Laryngeal mask airway  Findings: The endometrium was normal. There were no fibroid or other abnormalities.  Description of operation: The patient was taken to the operating room and placed in the supine position. She underwent general anesthesia using the laryngeal mask airway. She was placed in the dorsal lithotomy position and prepped and draped in the usual sterile fashion. A Graves speculum was placed and the anterior cervical lip was grasped with a single-tooth tenaculum. The cervix was dilated serially to allow passage of the hysteroscope. Diagnostic hysteroscopy was performed and was found to be normal. A vigorous uterine curettage was then performed and all tissue sent to pathology for evaluation.  I then proceeded to perform the Minerva endometrial ablation.   The uterus sounded to 9.5 cm The handpiece was attached to the Minerva power source/machine and the handpiece passed the checklist. The array was squeezed down to remove all of the air present.  The array was then place into the endometrial cavity and deployed to a length of 6.5 cm. The handpiece confirmed appropriate width by being in the green portion of the visual dial. The cervical cuff was then inflated to the point the CO2 indicator was in the green. The endometrial integrity check was then performed and integrity sequence was confirmed x 2. The heating was then begun and carried out for a total of 2 minutes(which is standard therapy time). When the plasma cycle was finished,  the cervical cuff was deflated and the array was removed with tissue present on the silicon membrane. There was appropriate post Minerva bleeding and uterine discharge.     All of the  equipment worked well throughout the procedure.  The patient was awakened from anesthesia and taken to the recovery room in good stable condition all counts were correct. She received 2 g of Ancef and 30 mg of Toradol preoperatively. She will be discharged from the recovery room and followed up in the office in 1- 2 weeks.   She can expect 4 weeks of post procedure bloody watery discharge  Lazaro Arms, MD  06/28/2021 10:20 AM

## 2021-06-28 NOTE — Anesthesia Procedure Notes (Signed)
Procedure Name: LMA Insertion Date/Time: 06/28/2021 9:52 AM Performed by: Junious Silk, CRNA Pre-anesthesia Checklist: Patient identified, Emergency Drugs available, Suction available, Patient being monitored and Timeout performed Patient Re-evaluated:Patient Re-evaluated prior to induction Oxygen Delivery Method: Circle system utilized Preoxygenation: Pre-oxygenation with 100% oxygen Induction Type: IV induction Ventilation: Mask ventilation without difficulty LMA: LMA inserted LMA Size: 4.0 Number of attempts: 1 Placement Confirmation: positive ETCO2, CO2 detector and breath sounds checked- equal and bilateral Tube secured with: Tape Dental Injury: Teeth and Oropharynx as per pre-operative assessment

## 2021-06-28 NOTE — H&P (Signed)
Preoperative History and Physical  Alyssa MastSamantha is a 45 year old G2P2002 status post tubal ligation who is in with long history of heavy menstrual bleeding.  Her periods are mostly normal but she will have occasional months where she has 2 or 3 days of bleeding otherwise She describes her bleeding is heavy with clots she wears tampons during the day change them all the time and pads in the evening and at night She sleeps on a approved sheet in a towel She has sold her clothes in the past and has soiled her sheets She has minimal cramping that is not really a significant issue Sonogram in 2020 and in 2021 most recently reviewed by me today are normal There are no fibroids associated no endometrial polyps Please see the report below   She has been on megestrol since January with significant decrease in her volume of bleeding but she has almost daily spotting  PMH:    Past Medical History:  Diagnosis Date   Anxiety    GERD (gastroesophageal reflux disease)    IBS (irritable bowel syndrome)    PONV (postoperative nausea and vomiting)    Skin disorder    possibly psoriosis, but pt is unsure. Causes red, spotchy, dry spots on body when it gets cold.    PSH:     Past Surgical History:  Procedure Laterality Date   CESAREAN SECTION  2006   [facility]   GANGLION CYST EXCISION Right 12/18/2013   Procedure: REMOVAL GANGLION CYST RIGHT FOOT;  Surgeon: Vickki HearingStanley E Harrison, MD;  Location: [facility];  Service: Orthopedics;  Laterality: Right;   TUBAL LIGATION      POb/GynH:      OB History     Gravida  2   Para  2   Term  2   Preterm      AB      Living  2      SAB      IAB      Ectopic      Multiple      Live Births  2           SH:   Social History   Tobacco Use   Smoking status: Never   Smokeless tobacco: Never  Vaping Use   Vaping Use: Never used  Substance Use Topics   Alcohol use: No   Drug use: No    FH:    Family History  Problem Relation  Age of Onset   Cancer Father        Lung   Cancer Paternal Grandfather    Cancer Paternal Grandmother    Cancer Maternal Grandmother    Hypertension Brother    Seizures Sister    Hypertension Brother      Allergies:  Allergies  Allergen Reactions   Penicillins Rash    Reaction: 10 Years    Medications:      No current facility-administered medications for this encounter.  Current Outpatient Medications:    calcium carbonate (TUMS EX) 750 MG chewable tablet, Chew 1 tablet by mouth daily as needed for heartburn., Disp: , Rfl:    dicyclomine (BENTYL) 20 MG tablet, TAKE 1 TABLET BY MOUTH 4 TIMES DAILY - BEFORE MEALS AND AT BEDTIME. (Patient taking differently: Take 20 mg by mouth 4 (four) times daily -  before meals and at bedtime.), Disp: 120 tablet, Rfl: 5   ferrous sulfate 325 (65 FE) MG EC tablet, Take 325 mg by mouth in the morning and  at bedtime., Disp: , Rfl:    fluticasone (FLONASE) 50 MCG/ACT nasal spray, SPRAY 2 SPRAYS INTO EACH NOSTRIL EVERY DAY (Patient taking differently: Place 2 sprays into both nostrils daily.), Disp: 16 mL, Rfl: 6   hydrochlorothiazide (HYDRODIURIL) 25 MG tablet, Take 1 tablet (25 mg total) by mouth daily. (Patient taking differently: Take 25 mg by mouth daily.), Disp: 30 tablet, Rfl: 5   hydrocortisone 2.5 % cream, Apply 1 application topically daily as needed (Rash)., Disp: , Rfl:    ibuprofen (ADVIL) 800 MG tablet, Take 1 tablet (800 mg total) by mouth every 8 (eight) hours as needed. (Patient taking differently: Take 800 mg by mouth every 8 (eight) hours as needed for moderate pain or mild pain.), Disp: 30 tablet, Rfl: 0   megestrol (MEGACE) 40 MG tablet, Takes 1 daily (Patient taking differently: Take 40 mg by mouth daily.), Disp: 60 tablet, Rfl: 2   meloxicam (MOBIC) 15 MG tablet, Take 15 mg by mouth daily., Disp: , Rfl:    omeprazole (PRILOSEC) 40 MG capsule, Take 1 capsule (40 mg total) by mouth 2 (two) times daily. On an empty stomach, Disp: 180  capsule, Rfl: 1   solifenacin (VESICARE) 10 MG tablet, Take 1 tablet (10 mg total) by mouth daily. For bladder control, Disp: 90 tablet, Rfl: 1   Vitamin D, Ergocalciferol, (DRISDOL) 1.25 MG (50000 UNIT) CAPS capsule, Take 1 capsule (50,000 Units total) by mouth every 7 (seven) days., Disp: 13 capsule, Rfl: 1   linaclotide (LINZESS) 145 MCG CAPS capsule, Take 1 capsule (145 mcg total) by mouth daily. To regulate bowel movements (Patient not taking: Reported on 06/15/2021), Disp: 30 capsule, Rfl: 5  Review of Systems:   Review of Systems  Constitutional: Negative for fever, chills, weight loss, malaise/fatigue and diaphoresis.  HENT: Negative for hearing loss, ear pain, nosebleeds, congestion, sore throat, neck pain, tinnitus and ear discharge.   Eyes: Negative for blurred vision, double vision, photophobia, pain, discharge and redness.  Respiratory: Negative for cough, hemoptysis, sputum production, shortness of breath, wheezing and stridor.   Cardiovascular: Negative for chest pain, palpitations, orthopnea, claudication, leg swelling and PND.  Gastrointestinal: Positive for abdominal pain. Negative for heartburn, nausea, vomiting, diarrhea, constipation, blood in stool and melena.  Genitourinary: Negative for dysuria, urgency, frequency, hematuria and flank pain.  Musculoskeletal: Negative for myalgias, back pain, joint pain and falls.  Skin: Negative for itching and rash.  Neurological: Negative for dizziness, tingling, tremors, sensory change, speech change, focal weakness, seizures, loss of consciousness, weakness and headaches.  Endo/Heme/Allergies: Negative for environmental allergies and polydipsia. Does not bruise/bleed easily.  Psychiatric/Behavioral: Negative for depression, suicidal ideas, hallucinations, memory loss and substance abuse. The patient is not nervous/anxious and does not have insomnia.      PHYSICAL EXAM:  There were no vitals taken for this visit.    Vitals  reviewed. Constitutional: She is oriented to person, place, and time. She appears well-developed and well-nourished.  HENT:  Head: Normocephalic and atraumatic.  Right Ear: External ear normal.  Left Ear: External ear normal.  Nose: Nose normal.  Mouth/Throat: Oropharynx is clear and moist.  Eyes: Conjunctivae and EOM are normal. Pupils are equal, round, and reactive to light. Right eye exhibits no discharge. Left eye exhibits no discharge. No scleral icterus.  Neck: Normal range of motion. Neck supple. No tracheal deviation present. No thyromegaly present.  Cardiovascular: Normal rate, regular rhythm, normal heart sounds and intact distal pulses.  Exam reveals no gallop and no friction rub.  No murmur heard. Respiratory: Effort normal and breath sounds normal. No respiratory distress. She has no wheezes. She has no rales. She exhibits no tenderness.  GI: Soft. Bowel sounds are normal. She exhibits no distension and no mass. There is tenderness. There is no rebound and no guarding.  Genitourinary:       Vulva is normal without lesions Vagina is pink moist without discharge Cervix normal in appearance and pap is normal Uterus is normal size, contour, position, consistency, mobility, non-tender Adnexa is negative with normal sized ovaries by sonogram  Musculoskeletal: Normal range of motion. She exhibits no edema and no tenderness.  Neurological: She is alert and oriented to person, place, and time. She has normal reflexes. She displays normal reflexes. No cranial nerve deficit. She exhibits normal muscle tone. Coordination normal.  Skin: Skin is warm and dry. No rash noted. No erythema. No pallor.  Psychiatric: She has a normal mood and affect. Her behavior is normal. Judgment and thought content normal.    Labs: Results for orders placed or performed during the hospital encounter of 06/22/21 (from the past 336 hour(s))  CBC   Collection Time: 06/22/21 10:20 AM  Result Value Ref Range    WBC 6.6 4.0 - 10.5 K/uL   RBC 4.93 3.87 - 5.11 MIL/uL   Hemoglobin 14.5 12.0 - 15.0 g/dL   HCT 72.0 94.7 - 09.6 %   MCV 88.6 80.0 - 100.0 fL   MCH 29.4 26.0 - 34.0 pg   MCHC 33.2 30.0 - 36.0 g/dL   RDW 28.3 66.2 - 94.7 %   Platelets 210 150 - 400 K/uL   nRBC 0.0 0.0 - 0.2 %  Comprehensive metabolic panel   Collection Time: 06/22/21 10:20 AM  Result Value Ref Range   Sodium 139 135 - 145 mmol/L   Potassium 3.5 3.5 - 5.1 mmol/L   Chloride 107 98 - 111 mmol/L   CO2 26 22 - 32 mmol/L   Glucose, Bld 100 (H) 70 - 99 mg/dL   BUN 13 6 - 20 mg/dL   Creatinine, Ser 6.54 0.44 - 1.00 mg/dL   Calcium 9.0 8.9 - 65.0 mg/dL   Total Protein 7.4 6.5 - 8.1 g/dL   Albumin 4.1 3.5 - 5.0 g/dL   AST 14 (L) 15 - 41 U/L   ALT 20 0 - 44 U/L   Alkaline Phosphatase 61 38 - 126 U/L   Total Bilirubin 0.8 0.3 - 1.2 mg/dL   GFR, Estimated >35 >46 mL/min   Anion gap 6 5 - 15  hCG, quantitative, pregnancy   Collection Time: 06/22/21 10:20 AM  Result Value Ref Range   hCG, Beta Chain, Quant, S <1 <5 mIU/mL  Rapid HIV screen (HIV 1/2 Ab+Ag)   Collection Time: 06/22/21 10:20 AM  Result Value Ref Range   HIV-1 P24 Antigen - HIV24 NON REACTIVE NON REACTIVE   HIV 1/2 Antibodies NON REACTIVE NON REACTIVE   Interpretation (HIV Ag Ab)      A non reactive test result means that HIV 1 or HIV 2 antibodies and HIV 1 p24 antigen were not detected in the specimen.  Urinalysis, Routine w reflex microscopic Urine, Clean Catch   Collection Time: 06/22/21 10:20 AM  Result Value Ref Range   Color, Urine YELLOW YELLOW   APPearance HAZY (A) CLEAR   Specific Gravity, Urine 1.015 1.005 - 1.030   pH 5.0 5.0 - 8.0   Glucose, UA NEGATIVE NEGATIVE mg/dL   Hgb urine dipstick MODERATE (A) NEGATIVE  Bilirubin Urine NEGATIVE NEGATIVE   Ketones, ur NEGATIVE NEGATIVE mg/dL   Protein, ur NEGATIVE NEGATIVE mg/dL   Nitrite NEGATIVE NEGATIVE   Leukocytes,Ua NEGATIVE NEGATIVE   RBC / HPF 0-5 0 - 5 RBC/hpf   WBC, UA 0-5 0 - 5  WBC/hpf   Bacteria, UA RARE (A) NONE SEEN   Squamous Epithelial / LPF 6-10 0 - 5    EKG: No orders found for this or any previous visit.  Imaging Studies: EXAM: TRANSABDOMINAL AND TRANSVAGINAL ULTRASOUND OF PELVIS   TECHNIQUE: Both transabdominal and transvaginal ultrasound examinations of the pelvis were performed. Transabdominal technique was performed for global imaging of the pelvis including uterus, ovaries, adnexal regions, and pelvic cul-de-sac. It was necessary to proceed with endovaginal exam following the transabdominal exam to visualize the endometrium and ovaries.   COMPARISON:  None   FINDINGS: Uterus   Measurements: 10.8 x 6.4 x 5.3 cm = volume: 191 mL. Anteverted. Suboptimally visualized on transvaginal imaging. No definite uterine mass   Endometrium   Thickness: 5 mm.  No endometrial fluid or focal abnormality   Right ovary   Measurements: 1.9 x 1.5 x 1.7 cm = volume: 2.5 mL. Suboptimally visualized. No gross mass.   Left ovary   Measurements: 1.9 x 2.7 x 1.6 cm = volume: 2.7 mL. Suboptimally visualized, no gross mass.   Other findings   No free pelvic fluid or adnexal masses.   IMPRESSION: No pelvic sonographic abnormalities identified.     Electronically Signed   By: Ulyses Southward M.D.   On: 03/02/2020 16:20     Assessment: Patient Active Problem List   Diagnosis Date Noted   Menorrhagia with irregular cycle 12/27/2020   Urge incontinence of urine 09/06/2020   Dysmenorrhea 09/06/2020   Morbid obesity (HCC) 02/23/2020   Essential hypertension 01/19/2019   Plantar fasciitis 12/31/2013   Ganglion cyst 12/21/2013   GASTROESOPHAGEAL REFLUX DISEASE 04/02/2008   IRRITABLE BOWEL SYNDROME 04/02/2008    Plan: Hysteroscopy uterine curettage Minerva endometrial ablation  Lazaro Arms 06/28/2021 8:26 AM

## 2021-06-28 NOTE — Anesthesia Postprocedure Evaluation (Signed)
Anesthesia Post Note  Patient: DEVOIRY CORRIHER  Procedure(s) Performed: DILATATION AND CURETTAGE/HYSTEROSCOPY WITH MINERVA (Vagina )  Patient location during evaluation: Phase II Anesthesia Type: General Level of consciousness: awake and alert and oriented Pain management: pain level controlled Vital Signs Assessment: post-procedure vital signs reviewed and stable Respiratory status: spontaneous breathing and respiratory function stable Cardiovascular status: blood pressure returned to baseline and stable Postop Assessment: no apparent nausea or vomiting Anesthetic complications: no   No notable events documented.   Last Vitals:  Vitals:   06/28/21 1045 06/28/21 1057  BP: 121/81 118/88  Pulse: (!) 52 62  Resp: (!) 21 20  Temp:  (!) 36.4 C  SpO2: 99% 98%    Last Pain:  Vitals:   06/28/21 1057  TempSrc: Oral  PainSc: 0-No pain                 Rodrecus Belsky C Adjoa Althouse

## 2021-06-28 NOTE — Transfer of Care (Signed)
Immediate Anesthesia Transfer of Care Note  Patient: Alyssa Arellano  Procedure(s) Performed: DILATATION AND CURETTAGE/HYSTEROSCOPY WITH MINERVA (Vagina )  Patient Location: PACU  Anesthesia Type:General  Level of Consciousness: awake, alert  and oriented  Airway & Oxygen Therapy: Patient Spontanous Breathing and Patient connected to nasal cannula oxygen  Post-op Assessment: Report given to RN and Post -op Vital signs reviewed and stable  Post vital signs: Reviewed and stable  Last Vitals:  Vitals Value Taken Time  BP 120/85  06/28/21  1025  Temp    Pulse 20 06/28/21  1025  Resp 19  06/28/21  1025  SpO2 95 06/28/21  1025    Last Pain:  Vitals:   06/28/21 0908  TempSrc: Oral  PainSc: 0-No pain      Patients Stated Pain Goal: 7 (06/28/21 0908)  Complications: No notable events documented.

## 2021-06-28 NOTE — Interval H&P Note (Signed)
History and Physical Interval Note:  06/28/2021 9:26 AM  Alyssa Arellano  has presented today for surgery, with the diagnosis of Menorrhagia Dysmenorrhea.  The various methods of treatment have been discussed with the patient and family. After consideration of risks, benefits and other options for treatment, the patient has consented to  Procedure(s): DILATATION AND CURETTAGE/HYSTEROSCOPY WITH MINERVA (N/A) as a surgical intervention.  The patient's history has been reviewed, patient examined, no change in status, stable for surgery.  I have reviewed the patient's chart and labs.  Questions were answered to the patient's satisfaction.     Lazaro Arms

## 2021-06-28 NOTE — Progress Notes (Signed)
Peripad with scant serosanguinous drainage, new peripad given.

## 2021-06-28 NOTE — Anesthesia Preprocedure Evaluation (Addendum)
Anesthesia Evaluation  Patient identified by MRN, date of birth, ID band Patient awake    Reviewed: Allergy & Precautions, NPO status , Patient's Chart, lab work & pertinent test results  History of Anesthesia Complications (+) PONV and history of anesthetic complications  Airway Mallampati: II  TM Distance: >3 FB Neck ROM: Full    Dental  (+) Dental Advisory Given, Chipped   Pulmonary neg pulmonary ROS,    Pulmonary exam normal breath sounds clear to auscultation       Cardiovascular Exercise Tolerance: Good hypertension, Pt. on medications Normal cardiovascular exam Rhythm:Regular Rate:Normal     Neuro/Psych Anxiety negative neurological ROS     GI/Hepatic Neg liver ROS, GERD  Medicated and Controlled,  Endo/Other  negative endocrine ROS  Renal/GU negative Renal ROS     Musculoskeletal negative musculoskeletal ROS (+)   Abdominal   Peds  Hematology negative hematology ROS (+)   Anesthesia Other Findings   Reproductive/Obstetrics negative OB ROS                            Anesthesia Physical Anesthesia Plan  ASA: 2  Anesthesia Plan: General   Post-op Pain Management:    Induction: Intravenous  PONV Risk Score and Plan: 4 or greater and Ondansetron, Dexamethasone, Midazolam and Scopolamine patch - Pre-op  Airway Management Planned: LMA  Additional Equipment:   Intra-op Plan:   Post-operative Plan: Extubation in OR  Informed Consent: I have reviewed the patients History and Physical, chart, labs and discussed the procedure including the risks, benefits and alternatives for the proposed anesthesia with the patient or authorized representative who has indicated his/her understanding and acceptance.     Dental advisory given  Plan Discussed with: CRNA and Surgeon  Anesthesia Plan Comments:        Anesthesia Quick Evaluation

## 2021-06-29 LAB — SURGICAL PATHOLOGY

## 2021-06-30 ENCOUNTER — Encounter (HOSPITAL_COMMUNITY): Payer: Self-pay | Admitting: Obstetrics & Gynecology

## 2021-07-10 ENCOUNTER — Encounter: Payer: Self-pay | Admitting: Obstetrics & Gynecology

## 2021-07-10 ENCOUNTER — Other Ambulatory Visit: Payer: Self-pay

## 2021-07-10 ENCOUNTER — Ambulatory Visit (INDEPENDENT_AMBULATORY_CARE_PROVIDER_SITE_OTHER): Payer: Medicaid Other | Admitting: Obstetrics & Gynecology

## 2021-07-10 VITALS — BP 113/80 | HR 60 | Ht 64.0 in | Wt 200.0 lb

## 2021-07-10 DIAGNOSIS — Z9889 Other specified postprocedural states: Secondary | ICD-10-CM

## 2021-07-10 NOTE — Progress Notes (Signed)
  HPI: Patient returns for routine postoperative follow-up having undergone hysteroscopy uterine curettage minerva endometrial ablation on 06/28/21.  The patient's immediate postoperative recovery has been unremarkable. Since hospital discharge the patient reports no problems.   Current Outpatient Medications: calcium carbonate (TUMS EX) 750 MG chewable tablet, Chew 1 tablet by mouth daily as needed for heartburn., Disp: , Rfl:  dicyclomine (BENTYL) 20 MG tablet, TAKE 1 TABLET BY MOUTH 4 TIMES DAILY - BEFORE MEALS AND AT BEDTIME. (Patient taking differently: Take 20 mg by mouth 4 (four) times daily -  before meals and at bedtime.), Disp: 120 tablet, Rfl: 5 fluticasone (FLONASE) 50 MCG/ACT nasal spray, SPRAY 2 SPRAYS INTO EACH NOSTRIL EVERY DAY (Patient taking differently: Place 2 sprays into both nostrils daily.), Disp: 16 mL, Rfl: 6 hydrochlorothiazide (HYDRODIURIL) 25 MG tablet, Take 1 tablet (25 mg total) by mouth daily. (Patient taking differently: Take 25 mg by mouth daily.), Disp: 30 tablet, Rfl: 5 HYDROcodone-acetaminophen (NORCO/VICODIN) 5-325 MG tablet, Take 1 tablet by mouth every 6 (six) hours as needed., Disp: 10 tablet, Rfl: 0 ketorolac (TORADOL) 10 MG tablet, Take 1 tablet (10 mg total) by mouth every 8 (eight) hours as needed., Disp: 15 tablet, Rfl: 0 meloxicam (MOBIC) 15 MG tablet, Take 15 mg by mouth daily., Disp: , Rfl:  omeprazole (PRILOSEC) 40 MG capsule, Take 1 capsule (40 mg total) by mouth 2 (two) times daily. On an empty stomach, Disp: 180 capsule, Rfl: 1 solifenacin (VESICARE) 10 MG tablet, Take 1 tablet (10 mg total) by mouth daily. For bladder control, Disp: 90 tablet, Rfl: 1 Vitamin D, Ergocalciferol, (DRISDOL) 1.25 MG (50000 UNIT) CAPS capsule, Take 1 capsule (50,000 Units total) by mouth every 7 (seven) days., Disp: 13 capsule, Rfl: 1 hydrocortisone 2.5 % cream, Apply 1 application topically daily as needed (Rash). (Patient not taking: Reported on 07/10/2021), Disp: , Rfl:    No current facility-administered medications for this visit.    Blood pressure 113/80, pulse 60, height 5\' 4"  (1.626 m), weight 200 lb (90.7 kg).  Physical Exam: Normal post ablation exam  Diagnostic Tests:   Pathology: benign  Impression:   ICD-10-CM   1. S/P endometrial ablation  Z98.890         Plan:     Follow up: prn   , MD

## 2021-11-07 ENCOUNTER — Other Ambulatory Visit: Payer: Self-pay | Admitting: Family Medicine

## 2021-11-07 DIAGNOSIS — K219 Gastro-esophageal reflux disease without esophagitis: Secondary | ICD-10-CM

## 2021-11-07 DIAGNOSIS — I10 Essential (primary) hypertension: Secondary | ICD-10-CM

## 2021-11-07 DIAGNOSIS — N920 Excessive and frequent menstruation with regular cycle: Secondary | ICD-10-CM

## 2021-11-07 DIAGNOSIS — N3941 Urge incontinence: Secondary | ICD-10-CM

## 2021-11-07 DIAGNOSIS — R3589 Other polyuria: Secondary | ICD-10-CM

## 2021-11-22 ENCOUNTER — Ambulatory Visit: Payer: Medicaid Other | Admitting: Family Medicine

## 2021-11-22 ENCOUNTER — Encounter: Payer: Self-pay | Admitting: Family Medicine

## 2021-11-22 VITALS — BP 113/72 | HR 60 | Temp 98.2°F | Ht 64.0 in | Wt 206.6 lb

## 2021-11-22 DIAGNOSIS — I10 Essential (primary) hypertension: Secondary | ICD-10-CM | POA: Diagnosis not present

## 2021-11-22 DIAGNOSIS — Z13220 Encounter for screening for lipoid disorders: Secondary | ICD-10-CM

## 2021-11-22 DIAGNOSIS — Z23 Encounter for immunization: Secondary | ICD-10-CM | POA: Diagnosis not present

## 2021-11-22 DIAGNOSIS — N3941 Urge incontinence: Secondary | ICD-10-CM

## 2021-11-22 DIAGNOSIS — R3589 Other polyuria: Secondary | ICD-10-CM

## 2021-11-22 DIAGNOSIS — I7300 Raynaud's syndrome without gangrene: Secondary | ICD-10-CM

## 2021-11-22 DIAGNOSIS — D509 Iron deficiency anemia, unspecified: Secondary | ICD-10-CM

## 2021-11-22 DIAGNOSIS — E559 Vitamin D deficiency, unspecified: Secondary | ICD-10-CM

## 2021-11-22 DIAGNOSIS — K219 Gastro-esophageal reflux disease without esophagitis: Secondary | ICD-10-CM

## 2021-11-22 DIAGNOSIS — M5416 Radiculopathy, lumbar region: Secondary | ICD-10-CM

## 2021-11-22 MED ORDER — VITAMIN D (ERGOCALCIFEROL) 1.25 MG (50000 UNIT) PO CAPS: 50000.0000 [IU] | ORAL_CAPSULE | ORAL | 1 refills | Status: DC

## 2021-11-22 MED ORDER — HYDROCHLOROTHIAZIDE 25 MG PO TABS: 25.0000 mg | ORAL_TABLET | Freq: Every day | ORAL | 2 refills | Status: DC

## 2021-11-22 MED ORDER — NIFEDIPINE ER OSMOTIC RELEASE 30 MG PO TB24: 30.0000 mg | ORAL_TABLET | Freq: Every day | ORAL | 1 refills | Status: DC

## 2021-11-22 MED ORDER — SOLIFENACIN SUCCINATE 10 MG PO TABS: 10.0000 mg | ORAL_TABLET | Freq: Every day | ORAL | 2 refills | Status: DC

## 2021-11-22 MED ORDER — OMEPRAZOLE 40 MG PO CPDR: 40.0000 mg | DELAYED_RELEASE_CAPSULE | Freq: Two times a day (BID) | ORAL | 2 refills | Status: DC

## 2021-11-22 NOTE — Progress Notes (Signed)
Subjective:  Patient ID: Alyssa Arellano, female    DOB: 1976-04-18  Age: 45 y.o. MRN: [id number]  CC: Medical Management of Chronic Issues   HPI Alyssa Arellano presents for Patient in for follow-up of GERD. Currently asymptomatic taking  PPI daily. There is no chest pain or heartburn. No hematemesis and no melena. No dysphagia or choking. Onset is remote. Progression is stable. Complicating factors, none.   presents for  follow-up of hypertension. Patient has no history of headache chest pain or shortness of breath or recent cough. Patient also denies symptoms of TIA such as focal numbness or weakness. Patient denies side effects from medication. States taking it regularly.    Having pain in R thigh  radiates from back. 7/10 when it flares. Present chronically, intermittently. Hurts in lumbar area and can feel tingling in her toes at the same time.   Pain at tips of fingers when they get cold, turn red, not blue. Very painful.   Depression screen [facility] 2/9 11/22/2021 11/22/2021 05/23/2021  Decreased Interest 0 0 0  Down, Depressed, Hopeless 0 0 0  PHQ - 2 Score 0 0 0  Altered sleeping 3 - -  Tired, decreased energy 2 - -  Change in appetite 0 - -  Feeling bad or failure about yourself  0 - -  Trouble concentrating 0 - -  Moving slowly or fidgety/restless 0 - -  Suicidal thoughts 0 - -  PHQ-9 Score 5 - -  Difficult doing work/chores Not difficult at all - -    History Alyssa Arellano has a past medical history of Anxiety, GERD (gastroesophageal reflux disease), IBS (irritable bowel syndrome), PONV (postoperative nausea and vomiting), and Skin disorder.   She has a past surgical history that includes Cesarean section (2006); Ganglion cyst excision (Right, 12/18/2013); Tubal ligation; and Dilatation and curettage/hysteroscopy with minerva (N/A, 06/28/2021).   Her family history includes Cancer in her father, maternal grandmother, paternal grandfather, and paternal grandmother; Hypertension  in her brother and brother; Seizures in her sister.She reports that she has never smoked. She has never used smokeless tobacco. She reports that she does not drink alcohol and does not use drugs.    ROS Review of Systems  Constitutional: Negative.   HENT: Negative.    Eyes:  Negative for visual disturbance.  Respiratory:  Negative for shortness of breath.   Cardiovascular:  Negative for chest pain.  Gastrointestinal:  Negative for abdominal pain.  Musculoskeletal:  Positive for back pain. Negative for arthralgias and myalgias.   Objective:  BP 113/72    Pulse 60    Temp 98.2 F (36.8 C)    Ht '5\' 4"'  (1.626 m)    Wt 206 lb 9.6 oz (93.7 kg)    SpO2 98%    BMI 35.46 kg/m   BP Readings from Last 3 Encounters:  11/22/21 113/72  07/10/21 113/80  06/28/21 118/88    Wt Readings from Last 3 Encounters:  11/22/21 206 lb 9.6 oz (93.7 kg)  07/10/21 200 lb (90.7 kg)  06/28/21 195 lb 15.8 oz (88.9 kg)     Physical Exam Constitutional:      General: She is not in acute distress.    Appearance: She is well-developed.  Cardiovascular:     Rate and Rhythm: Normal rate and regular rhythm.  Pulmonary:     Breath sounds: Normal breath sounds.  Musculoskeletal:        General: Normal range of motion.  Skin:    General: Skin  is warm and dry.  Neurological:     Mental Status: She is alert and oriented to person, place, and time.      Assessment & Plan:   Alyssa Arellano was seen today for medical management of chronic issues.  Diagnoses and all orders for this visit:  Essential hypertension -     CBC with Differential/Platelet -     CMP14+EGFR -     hydrochlorothiazide (HYDRODIURIL) 25 MG tablet; Take 1 tablet (25 mg total) by mouth daily.  Iron deficiency anemia, unspecified iron deficiency anemia type -     CBC with Differential/Platelet  Vitamin D deficiency -     VITAMIN D 25 Hydroxy (Vit-D Deficiency, Fractures) -     Vitamin D, Ergocalciferol, (DRISDOL) 1.25 MG (50000 UNIT)  CAPS capsule; Take 1 capsule (50,000 Units total) by mouth every 7 (seven) days.  Lipid screening -     Lipid panel  Polyuria -     solifenacin (VESICARE) 10 MG tablet; Take 1 tablet (10 mg total) by mouth daily. For bladder control  Urge incontinence of urine -     solifenacin (VESICARE) 10 MG tablet; Take 1 tablet (10 mg total) by mouth daily. For bladder control  Gastroesophageal reflux disease without esophagitis -     omeprazole (PRILOSEC) 40 MG capsule; Take 1 capsule (40 mg total) by mouth 2 (two) times daily. On an empty stomach  Lumbar radiculopathy, chronic -     Ambulatory referral to Neurology  Raynaud's disease without gangrene -     NIFEdipine (PROCARDIA-XL/NIFEDICAL-XL) 30 MG 24 hr tablet; Take 1 tablet (30 mg total) by mouth daily.      I have discontinued Hervey Ard. Cindric's ketorolac, HYDROcodone-acetaminophen, and meloxicam. I am also having her start on NIFEdipine. Additionally, I am having her maintain her calcium carbonate, hydrocortisone, fluticasone, dicyclomine, solifenacin, Vitamin D (Ergocalciferol), hydrochlorothiazide, and omeprazole.  Allergies as of 11/22/2021       Reactions   Penicillins Rash   Reaction: 10 Years        Medication List        Accurate as of November 22, 2021  9:06 AM. If you have any questions, ask your nurse or doctor.          STOP taking these medications    HYDROcodone-acetaminophen 5-325 MG tablet Commonly known as: NORCO/VICODIN Stopped by: Claretta Fraise, MD   ketorolac 10 MG tablet Commonly known as: TORADOL Stopped by: Claretta Fraise, MD   meloxicam 15 MG tablet Commonly known as: MOBIC Stopped by: Claretta Fraise, MD       TAKE these medications    calcium carbonate 750 MG chewable tablet Commonly known as: TUMS EX Chew 1 tablet by mouth daily as needed for heartburn.   dicyclomine 20 MG tablet Commonly known as: BENTYL TAKE 1 TABLET BY MOUTH 4 TIMES DAILY - BEFORE MEALS AND AT BEDTIME. What  changed:  how much to take how to take this when to take this additional instructions   fluticasone 50 MCG/ACT nasal spray Commonly known as: FLONASE SPRAY 2 SPRAYS INTO EACH NOSTRIL EVERY DAY What changed:  how much to take how to take this when to take this additional instructions   hydrochlorothiazide 25 MG tablet Commonly known as: HYDRODIURIL Take 1 tablet (25 mg total) by mouth daily. What changed: how much to take Changed by: Claretta Fraise, MD   hydrocortisone 2.5 % cream Apply 1 application topically daily as needed (Rash).   NIFEdipine 30 MG 24 hr tablet Commonly  known as: PROCARDIA-XL/NIFEDICAL-XL Take 1 tablet (30 mg total) by mouth daily. Started by: Claretta Fraise, MD   omeprazole 40 MG capsule Commonly known as: PRILOSEC Take 1 capsule (40 mg total) by mouth 2 (two) times daily. On an empty stomach   solifenacin 10 MG tablet Commonly known as: VESICARE Take 1 tablet (10 mg total) by mouth daily. For bladder control   Vitamin D (Ergocalciferol) 1.25 MG (50000 UNIT) Caps capsule Commonly known as: DRISDOL Take 1 capsule (50,000 Units total) by mouth every 7 (seven) days.         Follow-up: Return in about 6 months (around 05/23/2022).  Claretta Fraise, M.D.

## 2021-11-23 LAB — CMP14+EGFR
ALT: 14 IU/L (ref 0–32)
AST: 12 IU/L (ref 0–40)
Albumin/Globulin Ratio: 1.4 (ref 1.2–2.2)
Albumin: 4.1 g/dL (ref 3.8–4.8)
Alkaline Phosphatase: 73 IU/L (ref 44–121)
BUN/Creatinine Ratio: 11 (ref 9–23)
BUN: 11 mg/dL (ref 6–24)
Bilirubin Total: 0.4 mg/dL (ref 0.0–1.2)
CO2: 28 mmol/L (ref 20–29)
Calcium: 9.2 mg/dL (ref 8.7–10.2)
Chloride: 99 mmol/L (ref 96–106)
Creatinine, Ser: 0.98 mg/dL (ref 0.57–1.00)
Globulin, Total: 2.9 g/dL (ref 1.5–4.5)
Glucose: 113 mg/dL — ABNORMAL HIGH (ref 70–99)
Potassium: 3.7 mmol/L (ref 3.5–5.2)
Sodium: 139 mmol/L (ref 134–144)
Total Protein: 7 g/dL (ref 6.0–8.5)
eGFR: 73 mL/min/{1.73_m2} (ref >59)

## 2021-11-23 LAB — VITAMIN D 25 HYDROXY (VIT D DEFICIENCY, FRACTURES): Vit D, 25-Hydroxy: 63.7 ng/mL (ref 30.0–100.0)

## 2021-11-23 LAB — LIPID PANEL
Chol/HDL Ratio: 3.7 ratio (ref 0.0–4.4)
Cholesterol, Total: 168 mg/dL (ref 100–199)
HDL: 46 mg/dL (ref >39)
LDL Chol Calc (NIH): 107 mg/dL — ABNORMAL HIGH (ref 0–99)
Triglycerides: 82 mg/dL (ref 0–149)
VLDL Cholesterol Cal: 15 mg/dL (ref 5–40)

## 2021-11-23 LAB — CBC WITH DIFFERENTIAL/PLATELET
Basophils Absolute: 0.1 10*3/uL (ref 0.0–0.2)
Basos: 1 %
EOS (ABSOLUTE): 0.1 10*3/uL (ref 0.0–0.4)
Eos: 2 %
Hematocrit: 42.9 % (ref 34.0–46.6)
Hemoglobin: 14.8 g/dL (ref 11.1–15.9)
Immature Grans (Abs): 0 10*3/uL (ref 0.0–0.1)
Immature Granulocytes: 0 %
Lymphocytes Absolute: 1.7 10*3/uL (ref 0.7–3.1)
Lymphs: 23 %
MCH: 28.9 pg (ref 26.6–33.0)
MCHC: 34.5 g/dL (ref 31.5–35.7)
MCV: 84 fL (ref 79–97)
Monocytes Absolute: 0.5 10*3/uL (ref 0.1–0.9)
Monocytes: 8 %
Neutrophils Absolute: 4.8 10*3/uL (ref 1.4–7.0)
Neutrophils: 66 %
Platelets: 247 10*3/uL (ref 150–450)
RBC: 5.12 x10E6/uL (ref 3.77–5.28)
RDW: 13.2 % (ref 11.7–15.4)
WBC: 7.2 10*3/uL (ref 3.4–10.8)

## 2021-11-29 NOTE — Progress Notes (Signed)
Hello Scharlene,  Your lab result is normal and/or stable.Some minor variations that are not significant are commonly marked abnormal, but do not represent any medical problem for you.  Best regards, Mechele Claude, M.D.

## 2021-12-07 ENCOUNTER — Encounter: Payer: Self-pay | Admitting: Neurology

## 2021-12-07 ENCOUNTER — Ambulatory Visit: Payer: Medicaid Other | Admitting: Neurology

## 2021-12-07 VITALS — BP 127/80 | HR 61 | Ht 64.0 in | Wt 199.5 lb

## 2021-12-07 DIAGNOSIS — R208 Other disturbances of skin sensation: Secondary | ICD-10-CM

## 2021-12-07 DIAGNOSIS — M5441 Lumbago with sciatica, right side: Secondary | ICD-10-CM

## 2021-12-07 DIAGNOSIS — M5416 Radiculopathy, lumbar region: Secondary | ICD-10-CM

## 2021-12-07 DIAGNOSIS — G8929 Other chronic pain: Secondary | ICD-10-CM | POA: Diagnosis not present

## 2021-12-07 MED ORDER — PREGABALIN 75 MG PO CAPS: 75.0000 mg | ORAL_CAPSULE | Freq: Two times a day (BID) | ORAL | 3 refills | Status: DC

## 2021-12-07 NOTE — Progress Notes (Signed)
[facility]  PATIENT: Alyssa Arellano DOB: Feb 08, 1976  REFERRING DOCTOR OR PCP: Mechele Claude MD SOURCE: Patient, notes from primary care  _________________________________   HISTORICAL  CHIEF COMPLAINT:  Chief Complaint  Patient presents with   New Patient (Initial Visit)    Rm 2, with her Ex. Internal referral for R leg pn that radiates from LB. Pt has had sx for years, off/on. Laying and walking to long aggravates pn. Pt takes tylenol, was taking Meloxicam but taken off due to being on it for to long.     HISTORY OF PRESENT ILLNESS:  I had the pleasure of seeing your patient, Alyssa Arellano, at [facility] for neurologic consultation regarding her right leg pain.  She is a 45 year old woman who began to experience difficulties with the right leg/.  Pain has been preset about 5-6 years.   Pain starts in the lower back and then enters the right leg.  She gets a numb tingling sensation that is uncomfortable.   Pain is mostly in the thigh and lower leg and usually does not enter the foot.    She denies numbness when pain is not present.   She denies weakness.    She is on Vesicare for urinary leakage with benefit.      The pain is intermittent but is exacerbated by standing long time on a hard floor and sometimes walking longer.     In the past, she was on meloxicam but stopped as she had been on it a long time.   More recently she is taking Tylenol.   She has no h/o significant trauma.      She has no imaging of lower back.      REVIEW OF SYSTEMS: Constitutional: No fevers, chills, sweats, or change in appetite Eyes: No visual changes, double vision, eye pain Ear, nose and throat: No hearing loss, ear pain, nasal congestion, sore throat Cardiovascular: No chest pain, palpitations Respiratory:  No shortness of breath at rest or with exertion.   No wheezes GastrointestinaI: No nausea, vomiting, diarrhea, abdominal pain, fecal incontinence.   She has GERD. Genitourinary:  No dysuria, urinary retention.  She has urinary urgency/dribbling incontinence.. Musculoskeletal:  No neck pain, mild back pain.  She has Planter fasciitis. Integumentary: No rash, pruritus, skin lesions Neurological: as above Psychiatric: No depression at this time.  No anxiety Endocrine: No palpitations, diaphoresis, change in appetite, change in weigh or increased thirst Hematologic/Lymphatic:  No anemia, purpura, petechiae. Allergic/Immunologic: No itchy/runny eyes, nasal congestion, recent allergic reactions, rashes  ALLERGIES: Allergies  Allergen Reactions   Penicillins Rash    Reaction: 10 Years    HOME MEDICATIONS:  Current Outpatient Medications:    calcium carbonate (TUMS EX) 750 MG chewable tablet, Chew 1 tablet by mouth daily as needed for heartburn., Disp: , Rfl:    dicyclomine (BENTYL) 20 MG tablet, TAKE 1 TABLET BY MOUTH 4 TIMES DAILY - BEFORE MEALS AND AT BEDTIME. (Patient taking differently: Take 20 mg by mouth 4 (four) times daily -  before meals and at bedtime.), Disp: 120 tablet, Rfl: 5   fluticasone (FLONASE) 50 MCG/ACT nasal spray, SPRAY 2 SPRAYS INTO EACH NOSTRIL EVERY DAY (Patient taking differently: Place 2 sprays into both nostrils daily.), Disp: 16 mL, Rfl: 6   hydrochlorothiazide (HYDRODIURIL) 25 MG tablet, Take 1 tablet (25 mg total) by mouth daily., Disp: 90 tablet, Rfl: 2   hydrocortisone 2.5 % cream, Apply 1 application topically daily as needed (Rash)., Disp: ,  Rfl:    omeprazole (PRILOSEC) 40 MG capsule, Take 1 capsule (40 mg total) by mouth 2 (two) times daily. On an empty stomach, Disp: 180 capsule, Rfl: 2   solifenacin (VESICARE) 10 MG tablet, Take 1 tablet (10 mg total) by mouth daily. For bladder control, Disp: 90 tablet, Rfl: 2   Vitamin D, Ergocalciferol, (DRISDOL) 1.25 MG (50000 UNIT) CAPS capsule, Take 1 capsule (50,000 Units total) by mouth every 7 (seven) days., Disp: 13 capsule, Rfl: 1  PAST MEDICAL  HISTORY: Past Medical History:  Diagnosis Date   Anxiety    GERD (gastroesophageal reflux disease)    IBS (irritable bowel syndrome)    PONV (postoperative nausea and vomiting)    Skin disorder    possibly psoriosis, but pt is unsure. Causes red, spotchy, dry spots on body when it gets cold.    PAST SURGICAL HISTORY: Past Surgical History:  Procedure Laterality Date   CESAREAN SECTION  2006   [facility]-Dr. Ralph Dowdy   DILATATION AND CURETTAGE/HYSTEROSCOPY WITH MINERVA N/A 06/28/2021   Procedure: DILATATION AND CURETTAGE/HYSTEROSCOPY WITH MINERVA;  Surgeon: Lazaro Arms, MD;  Location: [facility];  Service: Gynecology;  Laterality: N/A;   GANGLION CYST EXCISION Right 12/18/2013   Procedure: REMOVAL GANGLION CYST RIGHT FOOT;  Surgeon: Vickki Hearing, MD;  Location: [facility];  Service: Orthopedics;  Laterality: Right;   TUBAL LIGATION      FAMILY HISTORY: Family History  Problem Relation Age of Onset   Cancer Father        Lung   Cancer Paternal Grandfather    Cancer Paternal Grandmother    Cancer Maternal Grandmother    Hypertension Brother    Seizures Sister    Hypertension Brother     SOCIAL HISTORY:  Social History   Socioeconomic History   Marital status: Single    Spouse name: Not on file   Number of children: Not on file   Years of education: Not on file   Highest education level: Not on file  Occupational History   Not on file  Tobacco Use   Smoking status: Never   Smokeless tobacco: Never  Vaping Use   Vaping Use: Never used  Substance and Sexual Activity   Alcohol use: No   Drug use: No   Sexual activity: Yes    Birth control/protection: Surgical    Comment: tubal  Other Topics Concern   Not on file  Social History Narrative   Lives with ex    Right handed   Caffeine: 3-4 cups of coffee, tea and soda a day   Social Determinants of Health   Financial Resource Strain: Low Risk    Difficulty of Paying Living Expenses: Not very hard  Food  Insecurity: No Food Insecurity   Worried About Programme researcher, broadcasting/film/video in the Last Year: Never true   Ran Out of Food in the Last Year: Never true  Transportation Needs: No Transportation Needs   Lack of Transportation (Medical): No   Lack of Transportation (Non-Medical): No  Physical Activity: Inactive   Days of Exercise per Week: 0 days   Minutes of Exercise per Session: 0 min  Stress: No Stress Concern Present   Feeling of Stress : Not at all  Social Connections: Socially Isolated   Frequency of Communication with Friends and Family: Three times a week   Frequency of Social Gatherings with Friends and Family: Once a week   Attends Religious Services: Never   Database administrator or Organizations:  No   Attends Banker Meetings: Never   Marital Status: Never married  Catering manager Violence: Not At Risk   Fear of Current or Ex-Partner: No   Emotionally Abused: No   Physically Abused: No   Sexually Abused: No     PHYSICAL EXAM  Vitals:   12/07/21 1011  BP: 127/80  Pulse: 61  Weight: 199 lb 8 oz (90.5 kg)  Height: 5\' 4"  (1.626 m)    Body mass index is 34.24 kg/m.   General: The patient is well-developed and well-nourished and in no acute distress  HEENT:  Head is /AT.  Sclera are anicteric.    Neck: No carotid bruits are noted.  The neck has good ROM  Cardiovascular: The heart has a regular rate and rhythm with a normal S1 and S2. There were no murmurs, gallops or rubs.    Skin: Extremities are without rash or  edema.  Musculoskeletal:  Back is mildly tender over lower lumbar paraspinal muscles.  No tenderness over the trochanteric bursae or the piriformis muscles.  Neurologic Exam  Mental status: The patient is alert and oriented x 3 at the time of the examination. The patient has apparent normal recent and remote memory, with an apparently normal attention span and concentration ability.   Speech is normal.  Cranial nerves: Extraocular movements  are full. Pupils are equal, round, and reactive to light and accomodation.  Visual fields are full.  Facial symmetry is present. There is good facial sensation to soft touch bilaterally.Facial strength is normal.  Trapezius and sternocleidomastoid strength is normal. No dysarthria is noted.   No obvious hearing deficits are noted.  Motor:  Muscle bulk is normal.   Tone is normal. Strength is  5 / 5 in all 4 extremities.   Sensory: Sensory testing is intact to pinprick, soft touch and vibration sensation in all 4 extremities.  Coordination: Cerebellar testing reveals good finger-nose-finger and heel-to-shin bilaterally.  Gait and station: Station is normal.   Gait is normal. Tandem gait is normal. Romberg is negative.   Reflexes: Deep tendon reflexes are symmetric and normal bilaterally.   Plantar responses are flexor.     DIAGNOSTIC DATA (LABS, IMAGING, TESTING) - I reviewed patient records, labs, notes, testing and imaging myself where available.  Lab Results  Component Value Date   WBC 7.2 11/22/2021   HGB 14.8 11/22/2021   HCT 42.9 11/22/2021   MCV 84 11/22/2021   PLT 247 11/22/2021      Component Value Date/Time   NA 139 11/22/2021 0908   K 3.7 11/22/2021 0908   CL 99 11/22/2021 0908   CO2 28 11/22/2021 0908   GLUCOSE 113 (H) 11/22/2021 0908   GLUCOSE 100 (H) 06/22/2021 1020   BUN 11 11/22/2021 0908   CREATININE 0.98 11/22/2021 0908   CALCIUM 9.2 11/22/2021 0908   PROT 7.0 11/22/2021 0908   ALBUMIN 4.1 11/22/2021 0908   AST 12 11/22/2021 0908   ALT 14 11/22/2021 0908   ALKPHOS 73 11/22/2021 0908   BILITOT 0.4 11/22/2021 0908   GFRNONAA >60 06/22/2021 1020   GFRAA 105 09/06/2020 1017   Lab Results  Component Value Date   CHOL 168 11/22/2021   HDL 46 11/22/2021   LDLCALC 107 (H) 11/22/2021   TRIG 82 11/22/2021   CHOLHDL 3.7 11/22/2021   No results found for: HGBA1C No results found for: VITAMINB12 Lab Results  Component Value Date   TSH 2.200 02/24/2020  ASSESSMENT AND PLAN  Chronic right-sided lumbar radiculopathy - Plan: MR LUMBAR SPINE WO CONTRAST, NCV with EMG(electromyography)  Dysesthesia - Plan: NCV with EMG(electromyography)  Chronic right-sided low back pain with right-sided sciatica - Plan: MR LUMBAR SPINE WO CONTRAST, NCV with EMG(electromyography)   In summary, Ms. Bowmer is a 45 year old woman with chronic right radiculopathy/sciatica.  She has not had much benefit with medications.  I will have her try pregabalin 75 mg p.o. twice daily.  We need to check an MRI of the lumbar spine and an EMG/NCV to determine if she has degenerative changes that would be best treated with injection or referral to surgery.  I will see her back when she comes in for the NCV/EMG study and she should call sooner if significant new or worsening neurologic symptoms.  Thank you for asking me to see Ms. Webb Silversmith.  Please let me know if I can be of further assistance with her or other patients in the future.   Daphane Odekirk A. Epimenio Foot, MD, [facility] 12/07/2021, 10:29 AM Certified in Neurology, Clinical Neurophysiology, Sleep Medicine and Neuroimaging  [facility] [address] [place], [place] [address] [phone]

## 2021-12-21 ENCOUNTER — Telehealth: Payer: Self-pay | Admitting: Neurology

## 2021-12-21 NOTE — Telephone Encounter (Signed)
[employer] pending faxed notes

## 2021-12-26 NOTE — Telephone Encounter (Signed)
I checked the status it is still pending, they did receive the fax of notes.

## 2022-01-01 NOTE — Telephone Encounter (Signed)
[employer] has not approved or denied the case, but I did get a message stating that they "have asked your doctor to confirm that you have signs of nerve damage in your low back. These signs could include abnormal reflexes or muscle weakness in your leg. This would support medical necessity."   Do you have any additional information I can submit or a peer to peer can be done. The phone number for the peer to peer would be [phone] and the member ID is [id number] but it the option for the peer to peer ends today.

## 2022-01-02 ENCOUNTER — Other Ambulatory Visit: Payer: Self-pay | Admitting: Neurology

## 2022-01-02 DIAGNOSIS — M5440 Lumbago with sciatica, unspecified side: Secondary | ICD-10-CM

## 2022-01-03 NOTE — Telephone Encounter (Signed)
I spoke with the patient and informed her that her insurance has denied the MRI Lumbar spine, but that Dr. Epimenio Foot wants her to have a Xray done. I gave her the phone number to [facility] and she will give them a call and get it scheduled.

## 2022-01-04 ENCOUNTER — Ambulatory Visit
Admission: RE | Admit: 2022-01-04 | Discharge: 2022-01-04 | Disposition: A | Discharge status: Home / Self Care | Payer: Medicaid Other | Source: Ambulatory Visit | Attending: Neurology | Admitting: Neurology

## 2022-01-04 DIAGNOSIS — M5440 Lumbago with sciatica, unspecified side: Secondary | ICD-10-CM

## 2022-01-04 DIAGNOSIS — M2578 Osteophyte, vertebrae: Secondary | ICD-10-CM | POA: Diagnosis not present

## 2022-01-04 DIAGNOSIS — M545 Low back pain, unspecified: Secondary | ICD-10-CM | POA: Diagnosis not present

## 2022-01-04 DIAGNOSIS — M4126 Other idiopathic scoliosis, lumbar region: Secondary | ICD-10-CM | POA: Diagnosis not present

## 2022-02-14 ENCOUNTER — Encounter: Payer: Medicaid Other | Admitting: Neurology

## 2022-02-26 ENCOUNTER — Encounter: Payer: Self-pay | Admitting: Neurology

## 2022-02-26 ENCOUNTER — Ambulatory Visit (INDEPENDENT_AMBULATORY_CARE_PROVIDER_SITE_OTHER): Payer: Medicaid Other | Admitting: Neurology

## 2022-02-26 ENCOUNTER — Encounter: Payer: Medicaid Other | Admitting: Neurology

## 2022-02-26 DIAGNOSIS — M5416 Radiculopathy, lumbar region: Secondary | ICD-10-CM | POA: Diagnosis not present

## 2022-02-26 DIAGNOSIS — G8929 Other chronic pain: Secondary | ICD-10-CM

## 2022-02-26 DIAGNOSIS — R208 Other disturbances of skin sensation: Secondary | ICD-10-CM | POA: Diagnosis not present

## 2022-02-26 DIAGNOSIS — Z0289 Encounter for other administrative examinations: Secondary | ICD-10-CM

## 2022-02-26 MED ORDER — PREGABALIN 75 MG PO CAPS: ORAL_CAPSULE | ORAL | 5 refills | Status: DC

## 2022-02-26 NOTE — Progress Notes (Signed)
? ? ? ?   ?Full Name: Alyssa Arellano Gender: Female ?MRN #: [id number] Date of Birth: 1976/11/25 ?   ?Visit Date: 02/26/2022 09:04 ?Age: 46 Years ?Examining Physician: Despina Arias, MD  ?Referring Physician: Despina Arias, MD ?   ? ?History:  ?She is a 46 year old woman right leg pain, numbness x several years.  She denies weakness.    Strength and DTRs were normal.   ? ?Nerve conduction studies: ?Bilateral peroneal and tibial motor responses had normal distal latencies, amplitudes and conduction velocities.  The tibial F-wave latencies were normal.  Bilateral sural and superficial peroneal sensory responses had normal peak latencies and amplitudes. ? ?Electromyography: ?Needle EMG of selected muscles of the right leg was performed.  There is no definite chronic denervation noted.  A couple of the L5/S1 innervated muscles showed she polyphasic motor units the recruitment was normal.  There was no abnormal spontaneous activity. ? ?Impression: ?This NCV/EMG study shows the following: ?No evidence of polyneuropathy or mononeuropathy in the legs ?There was no definite chronic radiculopathy noted.  However, couple of the L5 and/or S1 innervated muscles showed some polyphasic motor units and a minimal chronic L5 or S1 radiculopathy cannot be ruled out. ? ?Alyssa Arellano A. Alyssa Foot, MD, PhD, [place] ?Certified in Neurology, Clinical Neurophysiology, Sleep Medicine, Pain Medicine and Neuroimaging ?Director, [facility] ? ?[facility] ?[address], [address] ?[place], [place] [address] ?[phone] ? ?Clinical note: The pregabalin will be increased from 75 mg to twice a day to 75 mg in the morning and 150 mg at night. --RAS ?Verbal informed consent was obtained from the patient, patient was informed of potential risk of procedure, including bruising, bleeding, hematoma formation, infection, muscle weakness, muscle pain, numbness, among others. ?   ? ?   ?MNC ?   ?Nerve /  Sites Muscle Latency Ref. Amplitude Ref. Rel Amp Segments Distance Velocity Ref. Area  ?  ms ms mV mV %  cm m/s m/s mVms  ?L Peroneal - EDB  ?   Ankle EDB 4.1 ?6.5 4.7 ?2.0 100 Ankle - EDB 9   17.7  ?   Fib head EDB 7.7  4.0  85.4 Fib head - Ankle 25 69 ?44 18.0  ?   Pop fossa EDB 9.6  4.5  111 Pop fossa - Fib head 12 63 ?44 19.5  ?       Pop fossa - Ankle      ?R Peroneal - EDB  ?   Ankle EDB 3.5 ?6.5 6.8 ?2.0 100 Ankle - EDB 9   21.8  ?   Fib head EDB 7.5  6.2  91.6 Fib head - Ankle 26 65 ?44 20.8  ?   Pop fossa EDB 9.3  6.0  97 Pop fossa - Fib head 12 65 ?44 20.3  ?       Pop fossa - Ankle      ?L Tibial - AH  ?   Ankle AH 3.5 ?5.8 16.3 ?4.0 100 Ankle - AH 9   38.4  ?   Pop fossa AH 11.5  13.7  84.1 Pop fossa - Ankle 38 48 ?41 38.4  ?R Tibial - AH  ?   Ankle AH 2.9 ?5.8 11.0 ?4.0 100 Ankle - AH 9   36.9  ?   Pop fossa AH 11.2  10.2  92.4 Pop fossa - Ankle 36 44 ?41 36.2  ?           ?  SNC ?   ?Nerve / Sites Rec. Site Peak Lat Ref.  Amp Ref. Segments Distance  ?  ms ms ?V ?V  cm  ?L Sural - Ankle (Calf)  ?   Calf Ankle 3.2 ?4.4 16 ?6 Calf - Ankle 14  ?R Sural - Ankle (Calf)  ?   Calf Ankle 3.4 ?4.4 16 ?6 Calf - Ankle 14  ?L Superficial peroneal - Ankle  ?   Lat leg Ankle 2.8 ?4.4 7 ?6 Lat leg - Ankle 14  ?R Superficial peroneal - Ankle  ?   Lat leg Ankle 2.9 ?4.4 7 ?6 Lat leg - Ankle 14  ?           ?F  Wave ?   ?Nerve F Lat Ref.  ? ms ms  ?L Tibial - AH 47.0 ?56.0  ?R Tibial - AH 46.5 ?56.0  ?       ?EMG Summary Table   ? Spontaneous MUAP Recruitment  ?Muscle IA Fib PSW Fasc Other Amp Dur. Poly Pattern  ?R. Vastus medialis Normal None None None _______ Normal Normal Normal Normal  ?R. Tibialis anterior Normal None None None _______ Normal Normal 1+ Normal  ?R. Peroneus longus Normal None None None _______ Normal Normal 1+ Normal  ?R. Gastrocnemius (Medial head) Normal None None None _______ Normal Normal 1+ Normal  ?R. Abductor hallucis Normal None None None _______ Normal Normal 1+ Normal  ?R. Gluteus medius  Normal None None None _______ Normal Normal Normal Normal  ?R. Iliopsoas Normal None None None _______ Normal Normal Normal Normal  ? ?  ? ?

## 2022-03-17 ENCOUNTER — Other Ambulatory Visit: Payer: Self-pay | Admitting: Family Medicine

## 2022-03-17 DIAGNOSIS — K582 Mixed irritable bowel syndrome: Secondary | ICD-10-CM

## 2022-04-05 ENCOUNTER — Encounter: Payer: Self-pay | Admitting: Family Medicine

## 2022-04-05 ENCOUNTER — Ambulatory Visit: Payer: Medicaid Other | Admitting: Family Medicine

## 2022-04-05 VITALS — BP 119/73 | HR 50 | Temp 98.6°F | Ht 64.0 in | Wt 205.0 lb

## 2022-04-05 DIAGNOSIS — H6121 Impacted cerumen, right ear: Secondary | ICD-10-CM | POA: Diagnosis not present

## 2022-04-05 NOTE — Progress Notes (Signed)
?  ? ?Subjective:  ?Patient ID: Alyssa Arellano, female    DOB: 1976-10-05, 46 y.o.   MRN: [id number] ? ?Patient Care Team: ?Claretta Fraise, MD as PCP - General (Family Medicine)  ? ?Chief Complaint:  Ear Fullness (Pressure/Right side) ? ? ?HPI: ?Alyssa Arellano is a 46 y.o. female presenting on 04/05/2022 for Ear Fullness (Pressure/Right side) ? ? ?Pt reports she was outside last night and thinks a bug flew into her right ear. Complains of ear fullness.  ? ?Ear Fullness  ?There is pain in the right ear. This is a new problem. The current episode started yesterday. The problem has been unchanged. There has been no fever. The patient is experiencing no pain. Pertinent negatives include no abdominal pain, coughing, diarrhea, ear discharge, headaches, hearing loss, neck pain, rash, rhinorrhea, sore throat or vomiting. She has tried nothing for the symptoms.  ? ? ?Relevant past medical, surgical, family, and social history reviewed and updated as indicated.  ?Allergies and medications reviewed and updated. Data reviewed: Chart in Epic. ? ? ?Past Medical History:  ?Diagnosis Date  ? Anxiety   ? GERD (gastroesophageal reflux disease)   ? IBS (irritable bowel syndrome)   ? PONV (postoperative nausea and vomiting)   ? Skin disorder   ? possibly psoriosis, but pt is unsure. Causes red, spotchy, dry spots on body when it gets cold.  ? ? ?Past Surgical History:  ?Procedure Laterality Date  ? CESAREAN SECTION  2006  ? [facility]-Dr. Evie Lacks  ? DILATATION AND CURETTAGE/HYSTEROSCOPY WITH MINERVA N/A 06/28/2021  ? Procedure: DILATATION AND CURETTAGE/HYSTEROSCOPY WITH MINERVA;  Surgeon: Florian Buff, MD;  Location: [facility];  Service: Gynecology;  Laterality: N/A;  ? GANGLION CYST EXCISION Right 12/18/2013  ? Procedure: REMOVAL GANGLION CYST RIGHT FOOT;  Surgeon: Carole Civil, MD;  Location: [facility];  Service: Orthopedics;  Laterality: Right;  ? TUBAL LIGATION    ? ? ?Social History  ? ?Socioeconomic History  ? Marital status:  Single  ?  Spouse name: Not on file  ? Number of children: Not on file  ? Years of education: Not on file  ? Highest education level: Not on file  ?Occupational History  ? Not on file  ?Tobacco Use  ? Smoking status: Never  ? Smokeless tobacco: Never  ?Vaping Use  ? Vaping Use: Never used  ?Substance and Sexual Activity  ? Alcohol use: No  ? Drug use: No  ? Sexual activity: Yes  ?  Birth control/protection: Surgical  ?  Comment: tubal  ?Other Topics Concern  ? Not on file  ?Social History Narrative  ? Lives with ex   ? Right handed  ? Caffeine: 3-4 cups of coffee, tea and soda a day  ? ?Social Determinants of Health  ? ?Financial Resource Strain: Not on file  ?Food Insecurity: Not on file  ?Transportation Needs: Not on file  ?Physical Activity: Not on file  ?Stress: Not on file  ?Social Connections: Not on file  ?Intimate Partner Violence: Not on file  ? ? ?Outpatient Encounter Medications as of 04/05/2022  ?Medication Sig  ? calcium carbonate (TUMS EX) 750 MG chewable tablet Chew 1 tablet by mouth daily as needed for heartburn.  ? dicyclomine (BENTYL) 20 MG tablet TAKE 1 TABLET BY MOUTH 4 TIMES DAILY - BEFORE MEALS AND AT BEDTIME.  ? fluticasone (FLONASE) 50 MCG/ACT nasal spray SPRAY 2 SPRAYS INTO EACH NOSTRIL EVERY DAY (Patient taking differently: Place 2 sprays into both nostrils daily.)  ?  hydrochlorothiazide (HYDRODIURIL) 25 MG tablet Take 1 tablet (25 mg total) by mouth daily.  ? hydrocortisone 2.5 % cream Apply 1 application topically daily as needed (Rash).  ? omeprazole (PRILOSEC) 40 MG capsule Take 1 capsule (40 mg total) by mouth 2 (two) times daily. On an empty stomach  ? pregabalin (LYRICA) 75 MG capsule One po qAM and two po qHS  ? solifenacin (VESICARE) 10 MG tablet Take 1 tablet (10 mg total) by mouth daily. For bladder control  ? Vitamin D, Ergocalciferol, (DRISDOL) 1.25 MG (50000 UNIT) CAPS capsule Take 1 capsule (50,000 Units total) by mouth every 7 (seven) days.  ? ?No facility-administered  encounter medications on file as of 04/05/2022.  ? ? ?Allergies  ?Allergen Reactions  ? Penicillins Rash  ?  Reaction: 10 Years  ? ? ?Review of Systems  ?Constitutional:  Negative for activity change, appetite change, chills, fatigue and fever.  ?HENT:  Negative for ear discharge, ear pain, hearing loss, rhinorrhea, sore throat and tinnitus.   ?     Ear fullness, right  ?Eyes: Negative.  Negative for photophobia and visual disturbance.  ?Respiratory:  Negative for cough, chest tightness and shortness of breath.   ?Cardiovascular:  Negative for chest pain, palpitations and leg swelling.  ?Gastrointestinal:  Negative for abdominal pain, blood in stool, constipation, diarrhea, nausea and vomiting.  ?Endocrine: Negative.   ?Genitourinary:  Negative for decreased urine volume, difficulty urinating, dysuria, frequency and urgency.  ?Musculoskeletal:  Negative for arthralgias, myalgias and neck pain.  ?Skin: Negative.  Negative for rash.  ?Allergic/Immunologic: Negative.   ?Neurological:  Negative for dizziness, tremors, seizures, syncope, facial asymmetry, speech difficulty, weakness, light-headedness, numbness and headaches.  ?Hematological: Negative.   ?Psychiatric/Behavioral:  Negative for confusion, hallucinations, sleep disturbance and suicidal ideas.   ?All other systems reviewed and are negative. ? ?   ? ?Objective:  ?BP 119/73   Pulse (!) 50   Temp 98.6 ?F (37 ?C)   Ht '5\' 4"'  (1.626 m)   Wt 205 lb (93 kg)   LMP 03/22/2022 (Approximate)   SpO2 100%   BMI 35.19 kg/m?   ? ?Wt Readings from Last 3 Encounters:  ?04/05/22 205 lb (93 kg)  ?12/07/21 199 lb 8 oz (90.5 kg)  ?11/22/21 206 lb 9.6 oz (93.7 kg)  ? ? ?Physical Exam ?Vitals and nursing note reviewed.  ?Constitutional:   ?   General: She is not in acute distress. ?   Appearance: Normal appearance. She is obese. She is not ill-appearing, toxic-appearing or diaphoretic.  ?HENT:  ?   Head: Normocephalic and atraumatic.  ?   Right Ear: There is impacted cerumen.   ?   Left Ear: Tympanic membrane, ear canal and external ear normal.  ?   Nose: Nose normal.  ?   Mouth/Throat:  ?   Mouth: Mucous membranes are moist.  ?   Pharynx: Oropharynx is clear. No oropharyngeal exudate or posterior oropharyngeal erythema.  ?Eyes:  ?   Conjunctiva/sclera: Conjunctivae normal.  ?   Pupils: Pupils are equal, round, and reactive to light.  ?Cardiovascular:  ?   Rate and Rhythm: Normal rate and regular rhythm.  ?   Heart sounds: Normal heart sounds.  ?Pulmonary:  ?   Effort: Pulmonary effort is normal.  ?   Breath sounds: Normal breath sounds.  ?Skin: ?   General: Skin is warm and dry.  ?   Capillary Refill: Capillary refill takes less than 2 seconds.  ?Neurological:  ?   General:  No focal deficit present.  ?   Mental Status: She is alert and oriented to person, place, and time.  ?Psychiatric:     ?   Mood and Affect: Mood normal.     ?   Behavior: Behavior normal.     ?   Thought Content: Thought content normal.     ?   Judgment: Judgment normal.  ? ? ?Results for orders placed or performed in visit on 11/22/21  ?CBC with Differential/Platelet  ?Result Value Ref Range  ? WBC 7.2 3.4 - 10.8 x10E3/uL  ? RBC 5.12 3.77 - 5.28 x10E6/uL  ? Hemoglobin 14.8 11.1 - 15.9 g/dL  ? Hematocrit 42.9 34.0 - 46.6 %  ? MCV 84 79 - 97 fL  ? MCH 28.9 26.6 - 33.0 pg  ? MCHC 34.5 31.5 - 35.7 g/dL  ? RDW 13.2 11.7 - 15.4 %  ? Platelets 247 150 - 450 x10E3/uL  ? Neutrophils 66 Not Estab. %  ? Lymphs 23 Not Estab. %  ? Monocytes 8 Not Estab. %  ? Eos 2 Not Estab. %  ? Basos 1 Not Estab. %  ? Neutrophils Absolute 4.8 1.4 - 7.0 x10E3/uL  ? Lymphocytes Absolute 1.7 0.7 - 3.1 x10E3/uL  ? Monocytes Absolute 0.5 0.1 - 0.9 x10E3/uL  ? EOS (ABSOLUTE) 0.1 0.0 - 0.4 x10E3/uL  ? Basophils Absolute 0.1 0.0 - 0.2 x10E3/uL  ? Immature Granulocytes 0 Not Estab. %  ? Immature Grans (Abs) 0.0 0.0 - 0.1 x10E3/uL  ?CMP14+EGFR  ?Result Value Ref Range  ? Glucose 113 (H) 70 - 99 mg/dL  ? BUN 11 6 - 24 mg/dL  ? Creatinine, Ser 0.98 0.57 -  1.00 mg/dL  ? eGFR 73 >59 mL/min/1.73  ? BUN/Creatinine Ratio 11 9 - 23  ? Sodium 139 134 - 144 mmol/L  ? Potassium 3.7 3.5 - 5.2 mmol/L  ? Chloride 99 96 - 106 mmol/L  ? CO2 28 20 - 29 mmol/L  ? Calcium 9.2 8.7 - 1

## 2022-04-12 ENCOUNTER — Other Ambulatory Visit: Payer: Self-pay | Admitting: Family Medicine

## 2022-04-12 DIAGNOSIS — K582 Mixed irritable bowel syndrome: Secondary | ICD-10-CM

## 2022-04-26 ENCOUNTER — Other Ambulatory Visit: Payer: Self-pay | Admitting: Neurology

## 2022-04-26 ENCOUNTER — Encounter: Payer: Self-pay | Admitting: Neurology

## 2022-05-07 ENCOUNTER — Other Ambulatory Visit: Payer: Self-pay | Admitting: Family Medicine

## 2022-05-07 DIAGNOSIS — K582 Mixed irritable bowel syndrome: Secondary | ICD-10-CM

## 2022-05-21 ENCOUNTER — Ambulatory Visit: Payer: Medicaid Other | Admitting: Family Medicine

## 2022-05-21 ENCOUNTER — Encounter: Payer: Self-pay | Admitting: Family Medicine

## 2022-05-21 VITALS — BP 116/72 | HR 54 | Temp 98.6°F | Ht 64.0 in | Wt 196.6 lb

## 2022-05-21 DIAGNOSIS — N926 Irregular menstruation, unspecified: Secondary | ICD-10-CM | POA: Diagnosis not present

## 2022-05-21 DIAGNOSIS — Z23 Encounter for immunization: Secondary | ICD-10-CM | POA: Diagnosis not present

## 2022-05-21 DIAGNOSIS — I10 Essential (primary) hypertension: Secondary | ICD-10-CM

## 2022-05-21 MED ORDER — PREGABALIN 75 MG PO CAPS: ORAL_CAPSULE | ORAL | 5 refills | Status: DC

## 2022-05-21 NOTE — Progress Notes (Signed)
Subjective:  Patient ID: Alyssa Arellano, female    DOB: August 08, 1976  Age: 46 y.o. MRN: [id number]  CC: Medical Management of Chronic Issues   HPI Alyssa Arellano presents for  follow-up of hypertension. Patient has no history of headache chest pain or shortness of breath or recent cough. Patient also denies symptoms of TIA such as focal numbness or weakness. Patient denies side effects from medication. States taking it regularly.   Some PMS sx still. Had ablation last August. Overdue to see Dr. Elonda Husky.   Patient in for follow-up of GERD. Currently asymptomatic taking  PPI daily. There is no chest pain or heartburn. No hematemesis and no melena. No dysphagia or choking. Onset is remote. Progression is stable. Complicating factors, none.    History Alyssa Arellano has a past medical history of Anxiety, GERD (gastroesophageal reflux disease), IBS (irritable bowel syndrome), PONV (postoperative nausea and vomiting), and Skin disorder.   Alyssa Arellano has a past surgical history that includes Cesarean section (2006); Ganglion cyst excision (Right, 12/18/2013); Tubal ligation; and Dilatation and curettage/hysteroscopy with minerva (N/A, 06/28/2021).   Her family history includes Cancer in her father, maternal grandmother, paternal grandfather, and paternal grandmother; Hypertension in her brother and brother; Seizures in her sister.Alyssa Arellano reports that Alyssa Arellano has never smoked. Alyssa Arellano has never used smokeless tobacco. Alyssa Arellano reports that Alyssa Arellano does not drink alcohol and does not use drugs.  Current Outpatient Medications on File Prior to Visit  Medication Sig Dispense Refill   calcium carbonate (TUMS EX) 750 MG chewable tablet Chew 1 tablet by mouth daily as needed for heartburn.     dicyclomine (BENTYL) 20 MG tablet TAKE 1 TABLET BY MOUTH 4 TIMES DAILY - BEFORE MEALS AND AT BEDTIME. 120 tablet 1   fluticasone (FLONASE) 50 MCG/ACT nasal spray SPRAY 2 SPRAYS INTO EACH NOSTRIL EVERY DAY (Patient taking differently: Place 2 sprays  into both nostrils daily.) 16 mL 6   hydrochlorothiazide (HYDRODIURIL) 25 MG tablet Take 1 tablet (25 mg total) by mouth daily. 90 tablet 2   hydrocortisone 2.5 % cream Apply 1 application topically daily as needed (Rash).     omeprazole (PRILOSEC) 40 MG capsule Take 1 capsule (40 mg total) by mouth 2 (two) times daily. On an empty stomach 180 capsule 2   Vitamin D, Ergocalciferol, (DRISDOL) 1.25 MG (50000 UNIT) CAPS capsule Take 1 capsule (50,000 Units total) by mouth every 7 (seven) days. 13 capsule 1   No current facility-administered medications on file prior to visit.    ROS Review of Systems  Constitutional: Negative.   HENT: Negative.    Eyes:  Negative for visual disturbance.  Respiratory:  Negative for shortness of breath.   Cardiovascular:  Negative for chest pain.  Gastrointestinal:  Negative for abdominal pain.  Musculoskeletal:  Negative for arthralgias.    Objective:  BP 116/72   Pulse (!) 54   Temp 98.6 F (37 C)   Ht _0  (1.626 m)   Wt 196 lb 9.6 oz (89.2 kg)   LMP 05/19/2022   SpO2 98%   BMI 33.75 kg/m   BP Readings from Last 3 Encounters:  05/21/22 116/72  04/05/22 119/73  12/07/21 127/80    Wt Readings from Last 3 Encounters:  05/21/22 196 lb 9.6 oz (89.2 kg)  04/05/22 205 lb (93 kg)  12/07/21 199 lb 8 oz (90.5 kg)     Physical Exam Constitutional:      General: Alyssa Arellano is not in acute distress.    Appearance: Alyssa Arellano is  well-developed.  Cardiovascular:     Rate and Rhythm: Normal rate and regular rhythm.  Pulmonary:     Breath sounds: Normal breath sounds.  Musculoskeletal:        General: Normal range of motion.  Skin:    General: Skin is warm and dry.  Neurological:     Mental Status: Alyssa Arellano is alert and oriented to person, place, and time.       Assessment & Plan:   Alyssa Arellano was seen today for medical management of chronic issues.  Diagnoses and all orders for this visit:  Essential hypertension -     CBC with  Differential/Platelet -     CMP14+EGFR  Need for Tdap vaccination -     Tdap vaccine greater than or equal to 7yo IM  Morbid obesity ([place])  Menses, irregular  Other orders -     pregabalin (LYRICA) 75 MG capsule; One po qAM and two po qHS   Allergies as of 05/21/2022       Reactions   Penicillins Rash   Reaction: 10 Years        Medication List        Accurate as of May 21, 2022 10:12 AM. If you have any questions, ask your nurse or doctor.          STOP taking these medications    solifenacin 10 MG tablet Commonly known as: VESICARE Stopped by: Claretta Fraise, MD       TAKE these medications    calcium carbonate 750 MG chewable tablet Commonly known as: TUMS EX Chew 1 tablet by mouth daily as needed for heartburn.   dicyclomine 20 MG tablet Commonly known as: BENTYL TAKE 1 TABLET BY MOUTH 4 TIMES DAILY - BEFORE MEALS AND AT BEDTIME.   fluticasone 50 MCG/ACT nasal spray Commonly known as: FLONASE SPRAY 2 SPRAYS INTO EACH NOSTRIL EVERY DAY What changed:  how much to take how to take this when to take this additional instructions   hydrochlorothiazide 25 MG tablet Commonly known as: HYDRODIURIL Take 1 tablet (25 mg total) by mouth daily.   hydrocortisone 2.5 % cream Apply 1 application topically daily as needed (Rash).   omeprazole 40 MG capsule Commonly known as: PRILOSEC Take 1 capsule (40 mg total) by mouth 2 (two) times daily. On an empty stomach   pregabalin 75 MG capsule Commonly known as: Lyrica One po qAM and two po qHS   Vitamin D (Ergocalciferol) 1.25 MG (50000 UNIT) Caps capsule Commonly known as: DRISDOL Take 1 capsule (50,000 Units total) by mouth every 7 (seven) days.        Meds ordered this encounter  Medications   pregabalin (LYRICA) 75 MG capsule    Sig: One po qAM and two po qHS    Dispense:  90 capsule    Refill:  5    Folow up with Dr. Elonda Husky for PMS sx and irregular menstrual bleeding post ablation.    Follow-up: Return in about 6 months (around 11/20/2022), or if symptoms worsen or fail to improve.  Claretta Fraise, M.D.

## 2022-05-22 LAB — CBC WITH DIFFERENTIAL/PLATELET
Basophils Absolute: 0 10*3/uL (ref 0.0–0.2)
Basos: 1 %
EOS (ABSOLUTE): 0.2 10*3/uL (ref 0.0–0.4)
Eos: 3 %
Hematocrit: 44.8 % (ref 34.0–46.6)
Hemoglobin: 14.6 g/dL (ref 11.1–15.9)
Immature Grans (Abs): 0 10*3/uL (ref 0.0–0.1)
Immature Granulocytes: 0 %
Lymphocytes Absolute: 1.5 10*3/uL (ref 0.7–3.1)
Lymphs: 24 %
MCH: 27.8 pg (ref 26.6–33.0)
MCHC: 32.6 g/dL (ref 31.5–35.7)
MCV: 85 fL (ref 79–97)
Monocytes Absolute: 0.4 10*3/uL (ref 0.1–0.9)
Monocytes: 7 %
Neutrophils Absolute: 3.9 10*3/uL (ref 1.4–7.0)
Neutrophils: 65 %
Platelets: 245 10*3/uL (ref 150–450)
RBC: 5.25 x10E6/uL (ref 3.77–5.28)
RDW: 13.4 % (ref 11.7–15.4)
WBC: 6 10*3/uL (ref 3.4–10.8)

## 2022-05-22 LAB — CMP14+EGFR
ALT: 11 IU/L (ref 0–32)
AST: 9 IU/L (ref 0–40)
Albumin/Globulin Ratio: 1.4 (ref 1.2–2.2)
Albumin: 4.1 g/dL (ref 3.8–4.8)
Alkaline Phosphatase: 78 IU/L (ref 44–121)
BUN/Creatinine Ratio: 11 (ref 9–23)
BUN: 11 mg/dL (ref 6–24)
Bilirubin Total: 0.7 mg/dL (ref 0.0–1.2)
CO2: 26 mmol/L (ref 20–29)
Calcium: 9.1 mg/dL (ref 8.7–10.2)
Chloride: 100 mmol/L (ref 96–106)
Creatinine, Ser: 0.98 mg/dL (ref 0.57–1.00)
Globulin, Total: 3 g/dL (ref 1.5–4.5)
Glucose: 106 mg/dL — ABNORMAL HIGH (ref 70–99)
Potassium: 3.6 mmol/L (ref 3.5–5.2)
Sodium: 141 mmol/L (ref 134–144)
Total Protein: 7.1 g/dL (ref 6.0–8.5)
eGFR: 73 mL/min/{1.73_m2} (ref >59)

## 2022-05-22 NOTE — Progress Notes (Signed)
Hello Lourie, ° °Your lab result is normal and/or stable.Some minor variations that are not significant are commonly marked abnormal, but do not represent any medical problem for you. ° °Best regards, °Elisa Kutner, M.D.

## 2022-07-13 ENCOUNTER — Encounter: Payer: Self-pay | Admitting: Obstetrics & Gynecology

## 2022-07-13 ENCOUNTER — Ambulatory Visit: Payer: Medicaid Other | Admitting: Obstetrics & Gynecology

## 2022-07-13 VITALS — BP 113/81 | HR 59 | Ht 64.0 in | Wt 197.0 lb

## 2022-07-13 DIAGNOSIS — M545 Low back pain, unspecified: Secondary | ICD-10-CM | POA: Diagnosis not present

## 2022-07-13 DIAGNOSIS — N946 Dysmenorrhea, unspecified: Secondary | ICD-10-CM

## 2022-07-13 DIAGNOSIS — M79605 Pain in left leg: Secondary | ICD-10-CM | POA: Diagnosis not present

## 2022-07-13 DIAGNOSIS — M79604 Pain in right leg: Secondary | ICD-10-CM

## 2022-07-13 MED ORDER — MEGESTROL ACETATE 40 MG PO TABS: 40.0000 mg | ORAL_TABLET | Freq: Every day | ORAL | 4 refills | Status: DC

## 2022-07-13 NOTE — Progress Notes (Signed)
Chief Complaint  Patient presents with   having pain between legs      46 y.o. [id number] Patient's last menstrual period was 07/10/2022. The current method of family planning is tubal ligation.  Outpatient Encounter Medications as of 07/13/2022  Medication Sig   calcium carbonate (TUMS EX) 750 MG chewable tablet Chew 1 tablet by mouth daily as needed for heartburn.   dicyclomine (BENTYL) 20 MG tablet TAKE 1 TABLET BY MOUTH 4 TIMES DAILY - BEFORE MEALS AND AT BEDTIME.   fluticasone (FLONASE) 50 MCG/ACT nasal spray SPRAY 2 SPRAYS INTO EACH NOSTRIL EVERY DAY (Patient taking differently: Place 2 sprays into both nostrils daily.)   hydrochlorothiazide (HYDRODIURIL) 25 MG tablet Take 1 tablet (25 mg total) by mouth daily.   hydrocortisone 2.5 % cream Apply 1 application topically daily as needed (Rash).   megestrol (MEGACE) 40 MG tablet Take 1 tablet (40 mg total) by mouth daily.   omeprazole (PRILOSEC) 40 MG capsule Take 1 capsule (40 mg total) by mouth 2 (two) times daily. On an empty stomach   pregabalin (LYRICA) 75 MG capsule One po qAM and two po qHS   Vitamin D, Ergocalciferol, (DRISDOL) 1.25 MG (50000 UNIT) CAPS capsule Take 1 capsule (50,000 Units total) by mouth every 7 (seven) days.   No facility-administered encounter medications on file as of 07/13/2022.    Subjective Pt with low back pain with radiation down her legs R>L No associated with bleeding She has been placed on Lyrica for management  S/P endometrial ablation Menstrual associated bleeding is more than 50% better However she continues to have relatively light menses that last approximately 7 days Her associated pain with her period is better but continues  The pain in her low back and down her legs does not worsen during this period of time  Past Medical History:  Diagnosis Date   Anxiety    GERD (gastroesophageal reflux disease)    IBS (irritable bowel syndrome)    PONV (postoperative nausea and vomiting)     Skin disorder    possibly psoriosis, but pt is unsure. Causes red, spotchy, dry spots on body when it gets cold.    Past Surgical History:  Procedure Laterality Date   CESAREAN SECTION  2006   [facility]-Dr. Ralph Dowdy   DILATATION AND CURETTAGE/HYSTEROSCOPY WITH MINERVA N/A 06/28/2021   Procedure: DILATATION AND CURETTAGE/HYSTEROSCOPY WITH MINERVA;  Surgeon: Lazaro Arms, MD;  Location: [facility];  Service: Gynecology;  Laterality: N/A;   GANGLION CYST EXCISION Right 12/18/2013   Procedure: REMOVAL GANGLION CYST RIGHT FOOT;  Surgeon: Vickki Hearing, MD;  Location: [facility];  Service: Orthopedics;  Laterality: Right;   TUBAL LIGATION      OB History     Gravida  2   Para  2   Term  2   Preterm      AB      Living  2      SAB      IAB      Ectopic      Multiple      Live Births  2           Allergies  Allergen Reactions   Penicillins Rash    Reaction: 10 Years    Social History   Socioeconomic History   Marital status: Single    Spouse name: Not on file   Number of children: Not on file   Years of education: Not on file  Highest education level: Not on file  Occupational History   Not on file  Tobacco Use   Smoking status: Never   Smokeless tobacco: Never  Vaping Use   Vaping Use: Never used  Substance and Sexual Activity   Alcohol use: No   Drug use: No   Sexual activity: Yes    Birth control/protection: Surgical    Comment: tubal & ablation  Other Topics Concern   Not on file  Social History Narrative   Lives with ex    Right handed   Caffeine: 3-4 cups of coffee, tea and soda a day   Social Determinants of Health   Financial Resource Strain: Low Risk  (12/27/2020)   Overall Financial Resource Strain (CARDIA)    Difficulty of Paying Living Expenses: Not very hard  Food Insecurity: No Food Insecurity (12/27/2020)   Hunger Vital Sign    Worried About Running Out of Food in the Last Year: Never true    Ran Out of Food in the Last  Year: Never true  Transportation Needs: No Transportation Needs (12/27/2020)   PRAPARE - Administrator, Civil Service (Medical): No    Lack of Transportation (Non-Medical): No  Physical Activity: Inactive (12/27/2020)   Exercise Vital Sign    Days of Exercise per Week: 0 days    Minutes of Exercise per Session: 0 min  Stress: No Stress Concern Present (12/27/2020)   [employer] of Occupational Health - Occupational Stress Questionnaire    Feeling of Stress : Not at all  Social Connections: Socially Isolated (12/27/2020)   Social Connection and Isolation Panel [NHANES]    Frequency of Communication with Friends and Family: Three times a week    Frequency of Social Gatherings with Friends and Family: Once a week    Attends Religious Services: Never    Database administrator or Organizations: No    Attends Engineer, structural: Never    Marital Status: Never married    Family History  Problem Relation Age of Onset   Cancer Father        Lung   Cancer Paternal Grandfather    Cancer Paternal Grandmother    Cancer Maternal Grandmother    Hypertension Brother    Seizures Sister    Hypertension Brother     Medications:       Current Outpatient Medications:    calcium carbonate (TUMS EX) 750 MG chewable tablet, Chew 1 tablet by mouth daily as needed for heartburn., Disp: , Rfl:    dicyclomine (BENTYL) 20 MG tablet, TAKE 1 TABLET BY MOUTH 4 TIMES DAILY - BEFORE MEALS AND AT BEDTIME., Disp: 120 tablet, Rfl: 1   fluticasone (FLONASE) 50 MCG/ACT nasal spray, SPRAY 2 SPRAYS INTO EACH NOSTRIL EVERY DAY (Patient taking differently: Place 2 sprays into both nostrils daily.), Disp: 16 mL, Rfl: 6   hydrochlorothiazide (HYDRODIURIL) 25 MG tablet, Take 1 tablet (25 mg total) by mouth daily., Disp: 90 tablet, Rfl: 2   hydrocortisone 2.5 % cream, Apply 1 application topically daily as needed (Rash)., Disp: , Rfl:    megestrol (MEGACE) 40 MG tablet, Take 1 tablet (40 mg total)  by mouth daily., Disp: 30 tablet, Rfl: 4   omeprazole (PRILOSEC) 40 MG capsule, Take 1 capsule (40 mg total) by mouth 2 (two) times daily. On an empty stomach, Disp: 180 capsule, Rfl: 2   pregabalin (LYRICA) 75 MG capsule, One po qAM and two po qHS, Disp: 90 capsule, Rfl: 5  Vitamin D, Ergocalciferol, (DRISDOL) 1.25 MG (50000 UNIT) CAPS capsule, Take 1 capsule (50,000 Units total) by mouth every 7 (seven) days., Disp: 13 capsule, Rfl: 1  Objective Blood pressure 113/81, pulse (!) 59, height 5\' 4"  (1.626 m), weight 197 lb (89.4 kg), last menstrual period 07/10/2022.  General WDWN female NAD Vulva:  normal appearing vulva with no masses, tenderness or lesions Vagina:  normal mucosa, no discharge Cervix:  Normal no lesions Uterus:  normal size, contour, position, consistency, mobility, non-tender Adnexa: ovaries:present,  normal adnexa in size, nontender and no masses   Pertinent ROS No burning with urination, frequency or urgency No nausea, vomiting or diarrhea Nor fever chills or other constitutional symptoms   Labs or studies Reviewed her lumbar x-rays from January    Impression + Management Plan: Diagnoses this Encounter::   ICD-10-CM   1. Dysmenorrhea: improved post ablation  N94.6     2. Low back pain radiating to both legs  M54.50    M79.604    M79.605    on lyrica, distribution and exam do not suggest gyn etiology     Does not appear that her back and radiating pain down her legs is related to a GYN etiology With that said I will try to improve her bleeding and pain associated with bleeding At the time of her endometrial ablation I told her that we might have to use an IUD or progesterone only pill in order to optimize her bleeding and associated pain management  I will do continuous megestrol for the next 90 days and see her back in 3 months and see how she is done as far as bleeding and pain associated with her bleeding  Depending on her response I may do 3 months  on continuous progesterone and then 1 week off to try to maintain endometrial synchronicity long-term We will also discuss an IUD if she does well   Medications prescribed during  this encounter: Meds ordered this encounter  Medications   megestrol (MEGACE) 40 MG tablet    Sig: Take 1 tablet (40 mg total) by mouth daily.    Dispense:  30 tablet    Refill:  4    Labs or Scans Ordered during this encounter: No orders of the defined types were placed in this encounter.     Follow up Return in about 3 months (around 10/13/2022) for Follow up, with Dr 13/03/2022.

## 2022-08-10 ENCOUNTER — Other Ambulatory Visit: Payer: Self-pay | Admitting: Family Medicine

## 2022-08-10 DIAGNOSIS — K219 Gastro-esophageal reflux disease without esophagitis: Secondary | ICD-10-CM

## 2022-08-14 ENCOUNTER — Other Ambulatory Visit: Payer: Self-pay | Admitting: Family Medicine

## 2022-08-14 DIAGNOSIS — E559 Vitamin D deficiency, unspecified: Secondary | ICD-10-CM

## 2022-08-26 DIAGNOSIS — H5213 Myopia, bilateral: Secondary | ICD-10-CM | POA: Diagnosis not present

## 2022-09-06 ENCOUNTER — Other Ambulatory Visit: Payer: Self-pay | Admitting: Family Medicine

## 2022-09-06 DIAGNOSIS — K582 Mixed irritable bowel syndrome: Secondary | ICD-10-CM

## 2022-10-02 ENCOUNTER — Other Ambulatory Visit: Payer: Self-pay | Admitting: Family Medicine

## 2022-10-02 DIAGNOSIS — K582 Mixed irritable bowel syndrome: Secondary | ICD-10-CM

## 2022-10-11 ENCOUNTER — Encounter: Payer: Self-pay | Admitting: Obstetrics & Gynecology

## 2022-10-11 ENCOUNTER — Ambulatory Visit: Payer: Medicaid Other | Admitting: Obstetrics & Gynecology

## 2022-10-11 VITALS — BP 119/83 | HR 69 | Ht 64.0 in | Wt 193.0 lb

## 2022-10-11 DIAGNOSIS — N939 Abnormal uterine and vaginal bleeding, unspecified: Secondary | ICD-10-CM

## 2022-10-11 DIAGNOSIS — N946 Dysmenorrhea, unspecified: Secondary | ICD-10-CM | POA: Diagnosis not present

## 2022-10-11 MED ORDER — NORETHINDRONE 0.35 MG PO TABS: 1.0000 | ORAL_TABLET | Freq: Every day | ORAL | 11 refills | Status: DC

## 2022-10-11 NOTE — Progress Notes (Signed)
Follow up appointment for results: AUB  Chief Complaint  Patient presents with   Follow-up    Blood pressure 119/83, pulse 69, height 5\' 4"  (1.626 m), weight 193 lb (87.5 kg).  Previous note: Pt with low back pain with radiation down her legs R>L No associated with bleeding She has been placed on Lyrica for management   S/P endometrial ablation Menstrual associated bleeding is more than 50% better However she continues to have relatively light menses that last approximately 7 days Her associated pain with her period is better but continues   The pain in her low back and down her legs does not worsen during this period of time  She continues to have almost daily spotting Some pain She wants to avoid IUD for now Also wants to avoid hysterectomy if you can  Will switch gears with progesterone:      MEDS ordered this encounter: Meds ordered this encounter  Medications   norethindrone (MICRONOR) 0.35 MG tablet    Sig: Take 1 tablet (0.35 mg total) by mouth daily.    Dispense:  28 tablet    Refill:  11   Medications Discontinued During This Encounter  Medication Reason   megestrol (MEGACE) 40 MG tablet Change in therapy    Orders for this encounter: No orders of the defined types were placed in this encounter.   Impression + Management Plan   ICD-10-CM   1. Abnormal uterine bleeding (AUB): Dys synchronous  N93.9    good pain response to megestrol but continues to spot almost daily-->stop megestrol and begin micronor in 1 week and cycle with micronor, may evolve continuous    2. Dysmenorrhea: improved post ablation  N94.6       Follow Up: No follow-ups on file.     All questions were answered.  Past Medical History:  Diagnosis Date   Anxiety    GERD (gastroesophageal reflux disease)    IBS (irritable bowel syndrome)    PONV (postoperative nausea and vomiting)    Skin disorder    possibly psoriosis, but pt is unsure. Causes red, spotchy, dry spots on  body when it gets cold.    Past Surgical History:  Procedure Laterality Date   CESAREAN SECTION  2006   [facility]-Dr. Evie Lacks   DILATATION AND CURETTAGE/HYSTEROSCOPY WITH MINERVA N/A 06/28/2021   Procedure: DILATATION AND CURETTAGE/HYSTEROSCOPY WITH MINERVA;  Surgeon: Florian Buff, MD;  Location: [facility];  Service: Gynecology;  Laterality: N/A;   GANGLION CYST EXCISION Right 12/18/2013   Procedure: REMOVAL GANGLION CYST RIGHT FOOT;  Surgeon: Carole Civil, MD;  Location: [facility];  Service: Orthopedics;  Laterality: Right;   TUBAL LIGATION      OB History     Gravida  2   Para  2   Term  2   Preterm      AB      Living  2      SAB      IAB      Ectopic      Multiple      Live Births  2           Allergies  Allergen Reactions   Penicillins Rash    Reaction: 10 Years    Social History   Socioeconomic History   Marital status: Single    Spouse name: Not on file   Number of children: Not on file   Years of education: Not on file   Highest education level: Not  on file  Occupational History   Not on file  Tobacco Use   Smoking status: Never   Smokeless tobacco: Never  Vaping Use   Vaping Use: Never used  Substance and Sexual Activity   Alcohol use: No   Drug use: No   Sexual activity: Yes    Birth control/protection: Surgical    Comment: tubal & ablation  Other Topics Concern   Not on file  Social History Narrative   Lives with ex    Right handed   Caffeine: 3-4 cups of coffee, tea and soda a day   Social Determinants of Health   Financial Resource Strain: Low Risk  (12/27/2020)   Overall Financial Resource Strain (CARDIA)    Difficulty of Paying Living Expenses: Not very hard  Food Insecurity: No Food Insecurity (12/27/2020)   Hunger Vital Sign    Worried About Running Out of Food in the Last Year: Never true    Ran Out of Food in the Last Year: Never true  Transportation Needs: No Transportation Needs (12/27/2020)   PRAPARE -  Hydrologist (Medical): No    Lack of Transportation (Non-Medical): No  Physical Activity: Inactive (12/27/2020)   Exercise Vital Sign    Days of Exercise per Week: 0 days    Minutes of Exercise per Session: 0 min  Stress: No Stress Concern Present (12/27/2020)   [place]    Feeling of Stress : Not at all  Social Connections: Socially Isolated (12/27/2020)   Social Connection and Isolation Panel [NHANES]    Frequency of Communication with Friends and Family: Three times a week    Frequency of Social Gatherings with Friends and Family: Once a week    Attends Religious Services: Never    Marine scientist or Organizations: No    Attends Music therapist: Never    Marital Status: Never married    Family History  Problem Relation Age of Onset   Cancer Father        Lung   Cancer Paternal Grandfather    Cancer Paternal Grandmother    Cancer Maternal Grandmother    Hypertension Brother    Seizures Sister    Hypertension Brother

## 2022-10-12 ENCOUNTER — Ambulatory Visit: Payer: Medicaid Other | Admitting: Obstetrics & Gynecology

## 2022-11-19 ENCOUNTER — Encounter: Payer: Self-pay | Admitting: Family Medicine

## 2022-11-19 ENCOUNTER — Ambulatory Visit: Payer: Medicaid Other | Admitting: Family Medicine

## 2022-11-19 VITALS — BP 118/79 | HR 58 | Temp 98.0°F | Ht 64.0 in | Wt 196.6 lb

## 2022-11-19 DIAGNOSIS — I10 Essential (primary) hypertension: Secondary | ICD-10-CM | POA: Diagnosis not present

## 2022-11-19 DIAGNOSIS — Z13220 Encounter for screening for lipoid disorders: Secondary | ICD-10-CM

## 2022-11-19 DIAGNOSIS — Z79899 Other long term (current) drug therapy: Secondary | ICD-10-CM | POA: Diagnosis not present

## 2022-11-19 DIAGNOSIS — D509 Iron deficiency anemia, unspecified: Secondary | ICD-10-CM

## 2022-11-19 DIAGNOSIS — E559 Vitamin D deficiency, unspecified: Secondary | ICD-10-CM

## 2022-11-19 DIAGNOSIS — Z23 Encounter for immunization: Secondary | ICD-10-CM

## 2022-11-19 MED ORDER — HYDROCHLOROTHIAZIDE 25 MG PO TABS: 25.0000 mg | ORAL_TABLET | Freq: Every day | ORAL | 3 refills | Status: DC

## 2022-11-19 MED ORDER — PREGABALIN 300 MG PO CAPS: ORAL_CAPSULE | ORAL | 1 refills | Status: DC

## 2022-11-19 MED ORDER — CEFPROZIL 500 MG PO TABS: 500.0000 mg | ORAL_TABLET | Freq: Two times a day (BID) | ORAL | 0 refills | Status: DC

## 2022-11-19 MED ORDER — PSEUDOEPHEDRINE-GUAIFENESIN ER 120-1200 MG PO TB12: 1.0000 | ORAL_TABLET | Freq: Two times a day (BID) | ORAL | 99 refills | Status: DC

## 2022-11-19 NOTE — Progress Notes (Signed)
Subjective:  Patient ID: Alyssa Arellano, female    DOB: 25-Oct-1976  Age: 46 y.o. MRN: [id number]  CC: Medical Management of Chronic Issues   HPI Alyssa Arellano presents for  follow-up of hypertension. Patient has no history of headache chest pain or shortness of breath or recent cough. Patient also denies symptoms of TIA such as focal numbness or weakness. Patient denies side effects from medication. States taking it regularly.  lEFT EAR STOPPED UP. Onset 2 days ago. Not painful.   Pain reduced from 10/10 to 4/10 by the Lyrica.   History Alyssa Arellano has a past medical history of Anxiety, GERD (gastroesophageal reflux disease), IBS (irritable bowel syndrome), PONV (postoperative nausea and vomiting), and Skin disorder.   She has a past surgical history that includes Cesarean section (2006); Ganglion cyst excision (Right, 12/18/2013); Tubal ligation; and Dilatation and curettage/hysteroscopy with minerva (N/A, 06/28/2021).   Her family history includes Cancer in her father, maternal grandmother, paternal grandfather, and paternal grandmother; Hypertension in her brother and brother; Seizures in her sister.She reports that she has never smoked. She has never used smokeless tobacco. She reports that she does not drink alcohol and does not use drugs.  Current Outpatient Medications on File Prior to Visit  Medication Sig Dispense Refill   calcium carbonate (TUMS EX) 750 MG chewable tablet Chew 1 tablet by mouth daily as needed for heartburn.     dicyclomine (BENTYL) 20 MG tablet TAKE 1 TABLET BY MOUTH 4 TIMES DAILY - BEFORE MEALS AND AT BEDTIME. 360 tablet 1   fluticasone (FLONASE) 50 MCG/ACT nasal spray SPRAY 2 SPRAYS INTO EACH NOSTRIL EVERY DAY (Patient taking differently: Place 2 sprays into both nostrils daily.) 16 mL 6   hydrocortisone 2.5 % cream Apply 1 application topically daily as needed (Rash).     norethindrone (MICRONOR) 0.35 MG tablet Take 1 tablet (0.35 mg total) by mouth daily. 28  tablet 11   omeprazole (PRILOSEC) 40 MG capsule TAKE 1 CAPSULE (40 MG TOTAL) BY MOUTH 2 (TWO) TIMES DAILY. ON AN EMPTY STOMACH 180 capsule 2   solifenacin (VESICARE) 10 MG tablet Take 10 mg by mouth daily.     Vitamin D, Ergocalciferol, (DRISDOL) 1.25 MG (50000 UNIT) CAPS capsule TAKE 1 CAPSULE (50,000 UNITS TOTAL) BY MOUTH EVERY 7 (SEVEN) DAYS 13 capsule 1   No current facility-administered medications on file prior to visit.    ROS Review of Systems  Constitutional: Negative.   HENT: Negative.    Eyes:  Negative for visual disturbance.  Respiratory:  Negative for shortness of breath.   Cardiovascular:  Negative for chest pain.  Gastrointestinal:  Negative for abdominal pain.  Musculoskeletal:  Negative for arthralgias.    Objective:  BP 118/79   Pulse (!) 58   Temp 98 F (36.7 C)   Ht _0  (1.626 m)   Wt 196 lb 9.6 oz (89.2 kg)   SpO2 99%   BMI 33.75 kg/m   BP Readings from Last 3 Encounters:  11/19/22 118/79  10/11/22 119/83  07/13/22 113/81    Wt Readings from Last 3 Encounters:  11/19/22 196 lb 9.6 oz (89.2 kg)  10/11/22 193 lb (87.5 kg)  07/13/22 197 lb (89.4 kg)     Physical Exam Constitutional:      General: She is not in acute distress.    Appearance: She is well-developed.  Cardiovascular:     Rate and Rhythm: Normal rate and regular rhythm.  Pulmonary:     Breath sounds: Normal  breath sounds.  Musculoskeletal:        General: Normal range of motion.  Skin:    General: Skin is warm and dry.  Neurological:     Mental Status: She is alert and oriented to person, place, and time.       Assessment & Plan:   Alyssa Arellano was seen today for medical management of chronic issues.  Diagnoses and all orders for this visit:  Essential hypertension -     CBC with Differential/Platelet -     CMP14+EGFR -     hydrochlorothiazide (HYDRODIURIL) 25 MG tablet; Take 1 tablet (25 mg total) by mouth daily.  Vitamin D deficiency -     VITAMIN D 25 Hydroxy  (Vit-D Deficiency, Fractures)  Iron deficiency anemia, unspecified iron deficiency anemia type -     CBC with Differential/Platelet  Lipid screening -     Lipid panel  Controlled substance agreement signed -     ToxASSURE Select 13 (MW), Urine  Need for influenza vaccination -     Flu Vaccine QUAD 6+ mos PF IM (Fluarix Quad PF)  Other orders -     pregabalin (LYRICA) 300 MG capsule; One po qAM and two po qHS -     cefPROZIL (CEFZIL) 500 MG tablet; Take 1 tablet (500 mg total) by mouth 2 (two) times daily. For infection. Take all of this medication. -     Pseudoephedrine-Guaifenesin [phone] MG TB12; Take 1 tablet by mouth 2 (two) times daily. For congestion   Allergies as of 11/19/2022       Reactions   Penicillins Rash   Reaction: 10 Years        Medication List        Accurate as of November 19, 2022 10:41 PM. If you have any questions, ask your nurse or doctor.          calcium carbonate 750 MG chewable tablet Commonly known as: TUMS EX Chew 1 tablet by mouth daily as needed for heartburn.   cefPROZIL 500 MG tablet Commonly known as: CEFZIL Take 1 tablet (500 mg total) by mouth 2 (two) times daily. For infection. Take all of this medication. Started by: Claretta Fraise, MD   dicyclomine 20 MG tablet Commonly known as: BENTYL TAKE 1 TABLET BY MOUTH 4 TIMES DAILY - BEFORE MEALS AND AT BEDTIME.   fluticasone 50 MCG/ACT nasal spray Commonly known as: FLONASE SPRAY 2 SPRAYS INTO EACH NOSTRIL EVERY DAY What changed:  how much to take how to take this when to take this additional instructions   hydrochlorothiazide 25 MG tablet Commonly known as: HYDRODIURIL Take 1 tablet (25 mg total) by mouth daily.   hydrocortisone 2.5 % cream Apply 1 application topically daily as needed (Rash).   norethindrone 0.35 MG tablet Commonly known as: MICRONOR Take 1 tablet (0.35 mg total) by mouth daily.   omeprazole 40 MG capsule Commonly known as: PRILOSEC TAKE 1  CAPSULE (40 MG TOTAL) BY MOUTH 2 (TWO) TIMES DAILY. ON AN EMPTY STOMACH   pregabalin 300 MG capsule Commonly known as: Lyrica One po qAM and two po qHS What changed: medication strength Changed by: Claretta Fraise, MD   Pseudoephedrine-Guaifenesin [phone] MG Tb12 Take 1 tablet by mouth 2 (two) times daily. For congestion Started by: Claretta Fraise, MD   solifenacin 10 MG tablet Commonly known as: VESICARE Take 10 mg by mouth daily.   Vitamin D (Ergocalciferol) 1.25 MG (50000 UNIT) Caps capsule Commonly known as: DRISDOL TAKE 1 CAPSULE (50,000  UNITS TOTAL) BY MOUTH EVERY 7 (SEVEN) DAYS        Meds ordered this encounter  Medications   hydrochlorothiazide (HYDRODIURIL) 25 MG tablet    Sig: Take 1 tablet (25 mg total) by mouth daily.    Dispense:  90 tablet    Refill:  3   pregabalin (LYRICA) 300 MG capsule    Sig: One po qAM and two po qHS    Dispense:  90 capsule    Refill:  1   cefPROZIL (CEFZIL) 500 MG tablet    Sig: Take 1 tablet (500 mg total) by mouth 2 (two) times daily. For infection. Take all of this medication.    Dispense:  20 tablet    Refill:  0   Pseudoephedrine-Guaifenesin [phone] MG TB12    Sig: Take 1 tablet by mouth 2 (two) times daily. For congestion    Dispense:  12 tablet    Refill:  PRN      Follow-up: Return in about 6 months (around 05/21/2023).  Claretta Fraise, M.D.

## 2022-11-20 LAB — CBC WITH DIFFERENTIAL/PLATELET
Basophils Absolute: 0 10*3/uL (ref 0.0–0.2)
Basos: 1 %
EOS (ABSOLUTE): 0.2 10*3/uL (ref 0.0–0.4)
Eos: 3 %
Hematocrit: 43.6 % (ref 34.0–46.6)
Hemoglobin: 14.3 g/dL (ref 11.1–15.9)
Immature Grans (Abs): 0 10*3/uL (ref 0.0–0.1)
Immature Granulocytes: 1 %
Lymphocytes Absolute: 1.9 10*3/uL (ref 0.7–3.1)
Lymphs: 29 %
MCH: 28.8 pg (ref 26.6–33.0)
MCHC: 32.8 g/dL (ref 31.5–35.7)
MCV: 88 fL (ref 79–97)
Monocytes Absolute: 0.4 10*3/uL (ref 0.1–0.9)
Monocytes: 6 %
Neutrophils Absolute: 4.1 10*3/uL (ref 1.4–7.0)
Neutrophils: 60 %
Platelets: 264 10*3/uL (ref 150–450)
RBC: 4.97 x10E6/uL (ref 3.77–5.28)
RDW: 13.6 % (ref 11.7–15.4)
WBC: 6.6 10*3/uL (ref 3.4–10.8)

## 2022-11-20 LAB — CMP14+EGFR
ALT: 14 IU/L (ref 0–32)
AST: 10 IU/L (ref 0–40)
Albumin/Globulin Ratio: 1.5 (ref 1.2–2.2)
Albumin: 4.1 g/dL (ref 3.9–4.9)
Alkaline Phosphatase: 76 IU/L (ref 44–121)
BUN/Creatinine Ratio: 12 (ref 9–23)
BUN: 12 mg/dL (ref 6–24)
Bilirubin Total: 0.4 mg/dL (ref 0.0–1.2)
CO2: 27 mmol/L (ref 20–29)
Calcium: 9.1 mg/dL (ref 8.7–10.2)
Chloride: 98 mmol/L (ref 96–106)
Creatinine, Ser: 0.98 mg/dL (ref 0.57–1.00)
Globulin, Total: 2.7 g/dL (ref 1.5–4.5)
Glucose: 98 mg/dL (ref 70–99)
Potassium: 3.7 mmol/L (ref 3.5–5.2)
Sodium: 138 mmol/L (ref 134–144)
Total Protein: 6.8 g/dL (ref 6.0–8.5)
eGFR: 73 mL/min/{1.73_m2} (ref >59)

## 2022-11-20 LAB — LIPID PANEL
Chol/HDL Ratio: 4.3 ratio (ref 0.0–4.4)
Cholesterol, Total: 187 mg/dL (ref 100–199)
HDL: 44 mg/dL (ref >39)
LDL Chol Calc (NIH): 122 mg/dL — ABNORMAL HIGH (ref 0–99)
Triglycerides: 114 mg/dL (ref 0–149)
VLDL Cholesterol Cal: 21 mg/dL (ref 5–40)

## 2022-11-20 LAB — VITAMIN D 25 HYDROXY (VIT D DEFICIENCY, FRACTURES): Vit D, 25-Hydroxy: 63 ng/mL (ref 30.0–100.0)

## 2022-11-22 ENCOUNTER — Telehealth: Payer: Self-pay | Admitting: Family Medicine

## 2022-11-22 ENCOUNTER — Other Ambulatory Visit: Payer: Self-pay | Admitting: Family Medicine

## 2022-11-22 LAB — TOXASSURE SELECT 13 (MW), URINE

## 2022-11-22 MED ORDER — PREGABALIN 200 MG PO CAPS: 200.0000 mg | ORAL_CAPSULE | Freq: Every day | ORAL | 0 refills | Status: DC

## 2022-11-22 NOTE — Telephone Encounter (Signed)
  Pt says that rx pregabalin (LYRICA) 300 MG capsule  makes her sick on stomach and dizzy. Pt thinks that 300MG  is too strong. Pt wants to know if she can back on 75MG . Please call back [facility]

## 2022-11-22 NOTE — Telephone Encounter (Signed)
Please let the patient know that I sent their prescription to their pharmacy. Thanks, WS 

## 2022-11-22 NOTE — Telephone Encounter (Signed)
75 seems like a big decrease. What about 200?

## 2022-11-22 NOTE — Telephone Encounter (Signed)
Pt is ok with 200 of the lyrica

## 2022-12-11 ENCOUNTER — Ambulatory Visit: Payer: Medicaid Other | Admitting: Obstetrics & Gynecology

## 2022-12-17 IMAGING — CR DG LUMBAR SPINE 2-3V
2 series · 2 of 2 positions shown · non-contrast
Comparison: CT report 03/11/2003.

CLINICAL DATA: Back pain. Pain radiates into right lower extremity.
No known injury.

EXAM:
LUMBAR SPINE - 2-3 VIEW

[w lumbar spine ap]
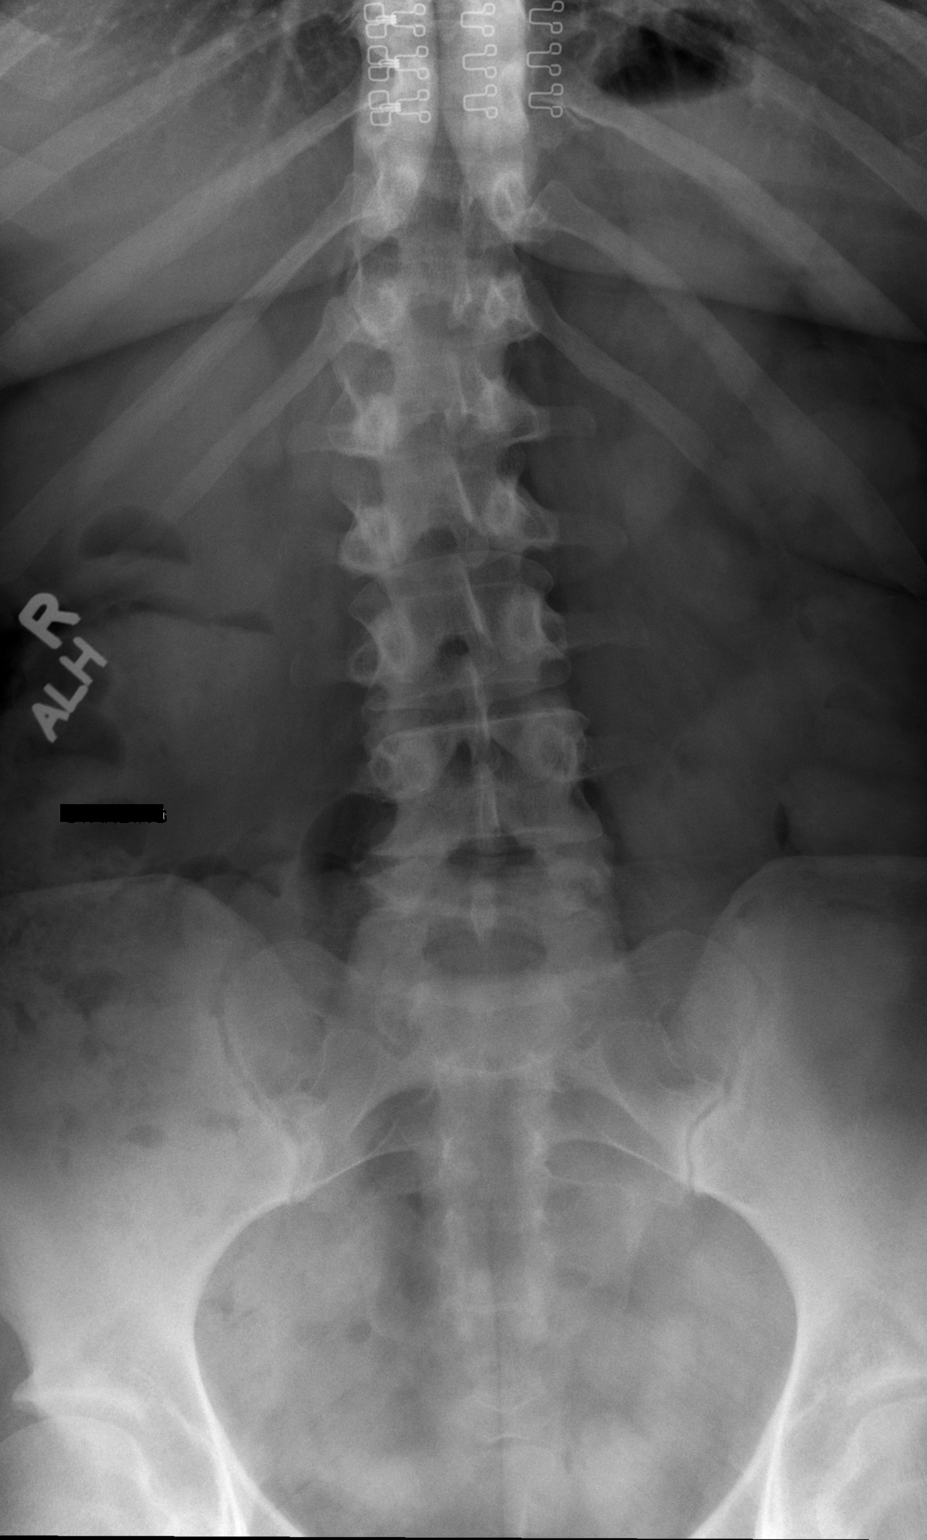

[w lumbar spine lat]
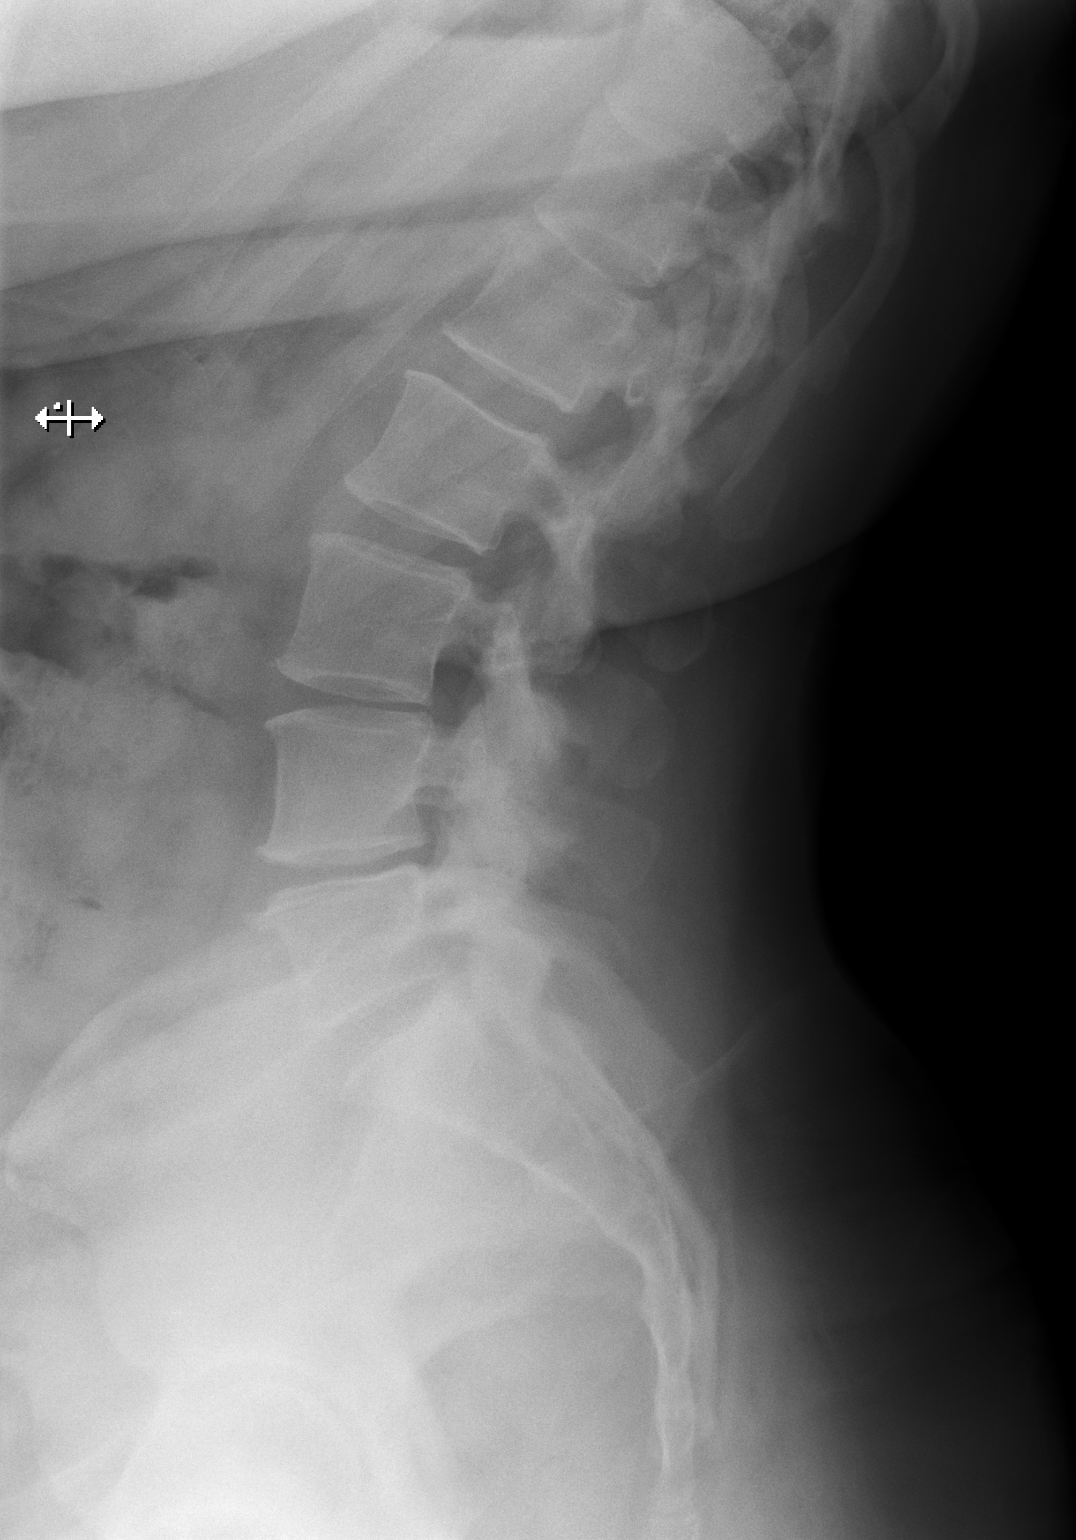

[2 of 2 positions shown; findings below may reference images not displayed]

FINDINGS: Lumbar spine scoliosis concave left. Diffuse multilevel disc
degeneration and endplate osteophyte formation. Diffuse facet
hypertrophy. Degenerative changes both SI joints and hips. No acute
bony abnormality identified.
IMPRESSION: Lumbar scoliosis concave left with diffuse multilevel degenerative
change. Degenerative changes both SI joints and hips. No acute bony
abnormality identified.

## 2022-12-21 ENCOUNTER — Other Ambulatory Visit: Payer: Self-pay | Admitting: Family Medicine

## 2022-12-21 DIAGNOSIS — R3589 Other polyuria: Secondary | ICD-10-CM

## 2022-12-21 DIAGNOSIS — N3941 Urge incontinence: Secondary | ICD-10-CM

## 2022-12-21 NOTE — Telephone Encounter (Signed)
Stacks patient  Last office visit 11/19/22 Fluticasone spray last refill 05/23/21 Solfenacin shows as historical medication and not as prescribed from our office.

## 2023-01-25 ENCOUNTER — Other Ambulatory Visit: Payer: Self-pay | Admitting: Family Medicine

## 2023-01-25 DIAGNOSIS — E559 Vitamin D deficiency, unspecified: Secondary | ICD-10-CM

## 2023-01-25 DIAGNOSIS — R3589 Other polyuria: Secondary | ICD-10-CM

## 2023-01-25 DIAGNOSIS — N3941 Urge incontinence: Secondary | ICD-10-CM

## 2023-02-23 ENCOUNTER — Other Ambulatory Visit: Payer: Self-pay | Admitting: Family Medicine

## 2023-05-21 ENCOUNTER — Ambulatory Visit: Payer: Medicaid Other | Admitting: Family Medicine

## 2023-05-21 ENCOUNTER — Encounter: Payer: Self-pay | Admitting: Family Medicine

## 2023-05-21 ENCOUNTER — Ambulatory Visit (INDEPENDENT_AMBULATORY_CARE_PROVIDER_SITE_OTHER): Payer: Medicaid Other

## 2023-05-21 VITALS — BP 111/74 | HR 65 | Temp 98.3°F | Ht 64.0 in | Wt 194.4 lb

## 2023-05-21 DIAGNOSIS — K219 Gastro-esophageal reflux disease without esophagitis: Secondary | ICD-10-CM | POA: Diagnosis not present

## 2023-05-21 DIAGNOSIS — R3589 Other polyuria: Secondary | ICD-10-CM | POA: Diagnosis not present

## 2023-05-21 DIAGNOSIS — G8929 Other chronic pain: Secondary | ICD-10-CM | POA: Diagnosis not present

## 2023-05-21 DIAGNOSIS — R109 Unspecified abdominal pain: Secondary | ICD-10-CM

## 2023-05-21 DIAGNOSIS — I10 Essential (primary) hypertension: Secondary | ICD-10-CM

## 2023-05-21 DIAGNOSIS — M25561 Pain in right knee: Secondary | ICD-10-CM

## 2023-05-21 DIAGNOSIS — M5416 Radiculopathy, lumbar region: Secondary | ICD-10-CM | POA: Insufficient documentation

## 2023-05-21 DIAGNOSIS — E782 Mixed hyperlipidemia: Secondary | ICD-10-CM | POA: Insufficient documentation

## 2023-05-21 DIAGNOSIS — N3941 Urge incontinence: Secondary | ICD-10-CM

## 2023-05-21 LAB — LIPID PANEL
Chol/HDL Ratio: 3.9 ratio (ref 0.0–4.4)
Cholesterol, Total: 175 mg/dL (ref 100–199)
HDL: 45 mg/dL (ref >39)
LDL Chol Calc (NIH): 114 mg/dL — ABNORMAL HIGH (ref 0–99)
Triglycerides: 85 mg/dL (ref 0–149)
VLDL Cholesterol Cal: 16 mg/dL (ref 5–40)

## 2023-05-21 LAB — CMP14+EGFR
ALT: 11 IU/L (ref 0–32)
AST: 11 IU/L (ref 0–40)
Albumin/Globulin Ratio: 1.3
Albumin: 4.3 g/dL (ref 3.9–4.9)
Alkaline Phosphatase: 78 IU/L (ref 44–121)
BUN/Creatinine Ratio: 11 (ref 9–23)
BUN: 12 mg/dL (ref 6–24)
Bilirubin Total: 0.6 mg/dL (ref 0.0–1.2)
CO2: 23 mmol/L (ref 20–29)
Calcium: 8.8 mg/dL (ref 8.7–10.2)
Chloride: 99 mmol/L (ref 96–106)
Creatinine, Ser: 1.07 mg/dL — ABNORMAL HIGH (ref 0.57–1.00)
Globulin, Total: 3.3 g/dL (ref 1.5–4.5)
Glucose: 92 mg/dL (ref 70–99)
Potassium: 3.8 mmol/L (ref 3.5–5.2)
Sodium: 139 mmol/L (ref 134–144)
Total Protein: 7.6 g/dL (ref 6.0–8.5)
eGFR: 65 mL/min/{1.73_m2} (ref >59)

## 2023-05-21 LAB — URINALYSIS
Bilirubin, UA: NEGATIVE
Glucose, UA: NEGATIVE
Ketones, UA: NEGATIVE
Nitrite, UA: NEGATIVE
Specific Gravity, UA: 1.015 (ref 1.005–1.030)
Urobilinogen, Ur: 0.2 mg/dL (ref 0.2–1.0)
pH, UA: 6.5 (ref 5.0–7.5)

## 2023-05-21 LAB — CBC WITH DIFFERENTIAL/PLATELET
Basophils Absolute: 0 10*3/uL (ref 0.0–0.2)
Basos: 1 %
EOS (ABSOLUTE): 0.2 10*3/uL (ref 0.0–0.4)
Eos: 3 %
Hematocrit: 45.3 % (ref 34.0–46.6)
Hemoglobin: 14.7 g/dL (ref 11.1–15.9)
Immature Grans (Abs): 0 10*3/uL (ref 0.0–0.1)
Immature Granulocytes: 0 %
Lymphocytes Absolute: 1.9 10*3/uL (ref 0.7–3.1)
Lymphs: 30 %
MCH: 28.2 pg (ref 26.6–33.0)
MCHC: 32.5 g/dL (ref 31.5–35.7)
MCV: 87 fL (ref 79–97)
Monocytes Absolute: 0.4 10*3/uL (ref 0.1–0.9)
Monocytes: 7 %
Neutrophils Absolute: 3.8 10*3/uL (ref 1.4–7.0)
Neutrophils: 59 %
Platelets: 229 10*3/uL (ref 150–450)
RBC: 5.22 x10E6/uL (ref 3.77–5.28)
RDW: 13.9 % (ref 11.7–15.4)
WBC: 6.4 10*3/uL (ref 3.4–10.8)

## 2023-05-21 MED ORDER — TIZANIDINE HCL 4 MG PO TABS: 4.0000 mg | ORAL_TABLET | Freq: Four times a day (QID) | ORAL | 2 refills | Status: DC | PRN

## 2023-05-21 MED ORDER — OMEPRAZOLE 40 MG PO CPDR: 40.0000 mg | DELAYED_RELEASE_CAPSULE | Freq: Two times a day (BID) | ORAL | 3 refills | Status: DC

## 2023-05-21 MED ORDER — SOLIFENACIN SUCCINATE 10 MG PO TABS: ORAL_TABLET | ORAL | 3 refills | Status: DC

## 2023-05-21 MED ORDER — NABUMETONE 500 MG PO TABS
1000.0000 mg | ORAL_TABLET | Freq: Two times a day (BID) | ORAL | 1 refills | Status: DC
Start: 2023-05-21 — End: 2023-11-20

## 2023-05-21 MED ORDER — PREGABALIN 200 MG PO CAPS: 200.0000 mg | ORAL_CAPSULE | Freq: Every day | ORAL | 0 refills | Status: DC

## 2023-05-21 MED ORDER — BETAMETHASONE SOD PHOS & ACET 6 (3-3) MG/ML IJ SUSP
6.0000 mg | Freq: Once | INTRAMUSCULAR | Status: AC
Start: 2023-05-21 — End: 2023-05-21
  Administered 2023-05-21: 6 mg via INTRAMUSCULAR

## 2023-05-21 NOTE — Progress Notes (Signed)
Subjective:  Patient ID: Alyssa Arellano, female    DOB: 1976-08-24  Age: 47 y.o. MRN: [id number]  CC: Medical Management of Chronic Issues   HPI Alyssa Arellano presents for back pain and right knee pain. Points to right knee, anterior joint line laterally on the bone. Onset 2-3 months ago. NKI  Back pain is 5-6/10 Pain is lumbar and posterior base of the neck.   Patient in for follow-up of GERD. Currently asymptomatic taking  PPI daily. There is no chest pain or heartburn. No hematemesis and no melena. No dysphagia or choking. Onset is remote. Progression is stable. Complicating factors, none.   presents for  follow-up of hypertension. Patient has no history of headache chest pain or shortness of breath or recent cough. Patient also denies symptoms of TIA such as focal numbness or weakness. Patient denies side effects from medication. States taking it regularly.  Going to the bathroom to urinate a lot. Not sure if this is a problem. She has had some back pain at the upper lumbar region radiating to the right flank     05/21/2023    9:14 AM 11/19/2022    8:30 AM 05/21/2022    9:39 AM  Depression screen PHQ 2/9  Decreased Interest 0 0 0  Down, Depressed, Hopeless 0 0 0  PHQ - 2 Score 0 0 0  Altered sleeping   3  Tired, decreased energy   2  Change in appetite   0  Feeling bad or failure about yourself    0  Trouble concentrating   0  Moving slowly or fidgety/restless   0  Suicidal thoughts   0  PHQ-9 Score   5  Difficult doing work/chores   Not difficult at all    History Alyssa Arellano has a past medical history of Anxiety, GERD (gastroesophageal reflux disease), IBS (irritable bowel syndrome), PONV (postoperative nausea and vomiting), and Skin disorder.   She has a past surgical history that includes Cesarean section (2006); Ganglion cyst excision (Right, 12/18/2013); Tubal ligation; and Dilatation and curettage/hysteroscopy with minerva (N/A, 06/28/2021).   Her family history  includes Cancer in her father, maternal grandmother, paternal grandfather, and paternal grandmother; Hypertension in her brother and brother; Seizures in her sister.She reports that she has never smoked. She has never used smokeless tobacco. She reports that she does not drink alcohol and does not use drugs.    ROS Review of Systems  Constitutional: Negative.   HENT: Negative.    Eyes:  Negative for visual disturbance.  Respiratory:  Negative for shortness of breath.   Cardiovascular:  Negative for chest pain.  Gastrointestinal:  Negative for abdominal pain.  Endocrine: Positive for polyuria.  Musculoskeletal:  Positive for arthralgias and back pain.    Objective:  BP 111/74   Pulse 65   Temp 98.3 F (36.8 C)   Ht 5\' 4"  (1.626 m)   Wt 194 lb 6.4 oz (88.2 kg)   SpO2 97%   BMI 33.37 kg/m   BP Readings from Last 3 Encounters:  05/21/23 111/74  11/19/22 118/79  10/11/22 119/83    Wt Readings from Last 3 Encounters:  05/21/23 194 lb 6.4 oz (88.2 kg)  11/19/22 196 lb 9.6 oz (89.2 kg)  10/11/22 193 lb (87.5 kg)     Physical Exam Constitutional:      General: She is not in acute distress.    Appearance: She is well-developed.  Cardiovascular:     Rate and Rhythm: Normal rate and  regular rhythm.  Pulmonary:     Breath sounds: Normal breath sounds.  Musculoskeletal:        General: Normal range of motion.  Skin:    General: Skin is warm and dry.  Neurological:     Mental Status: She is alert and oriented to person, place, and time.       Assessment & Plan:   Ady was seen today for medical management of chronic issues.  Diagnoses and all orders for this visit:  Essential hypertension -     CBC with Differential/Platelet -     CMP14+EGFR  Mixed hyperlipidemia -     Lipid panel  Gastroesophageal reflux disease without esophagitis -     omeprazole (PRILOSEC) 40 MG capsule; Take 1 capsule (40 mg total) by mouth 2 (two) times daily. On an empty  stomach  Urge incontinence -     solifenacin (VESICARE) 10 MG tablet; TAKE 1 TABLET (10 MG TOTAL) BY MOUTH DAILY. FOR BLADDER CONTROL -     Urinalysis  Other polyuria -     solifenacin (VESICARE) 10 MG tablet; TAKE 1 TABLET (10 MG TOTAL) BY MOUTH DAILY. FOR BLADDER CONTROL -     Urinalysis  Right flank pain -     Urinalysis -     Urine Culture  Chronic pain of right knee -     DG Knee 1-2 Views Right; Future -     betamethasone acetate-betamethasone sodium phosphate (CELESTONE) injection 6 mg  Lumbar radiculopathy, chronic -     betamethasone acetate-betamethasone sodium phosphate (CELESTONE) injection 6 mg  Other orders -     pregabalin (LYRICA) 200 MG capsule; Take 1 capsule (200 mg total) by mouth at bedtime. -     tiZANidine (ZANAFLEX) 4 MG tablet; Take 1 tablet (4 mg total) by mouth every 6 (six) hours as needed for muscle spasms. -     nabumetone (RELAFEN) 500 MG tablet; Take 2 tablets (1,000 mg total) by mouth 2 (two) times daily. For muscle and joint pain       I have discontinued Alyssa Arellano's cefPROZIL and Pseudoephedrine-Guaifenesin. I am also having her start on tiZANidine and nabumetone. Additionally, I am having her maintain her calcium carbonate, hydrocortisone, dicyclomine, norethindrone, hydrochlorothiazide, fluticasone, Vitamin D (Ergocalciferol), omeprazole, solifenacin, and pregabalin. We administered betamethasone acetate-betamethasone sodium phosphate.  Allergies as of 05/21/2023       Reactions   Penicillins Rash   Reaction: 10 Years        Medication List        Accurate as of May 21, 2023  6:01 PM. If you have any questions, ask your nurse or doctor.          STOP taking these medications    cefPROZIL 500 MG tablet Commonly known as: CEFZIL Stopped by: Mechele Claude, MD   Pseudoephedrine-Guaifenesin [phone] MG Tb12 Stopped by: Mechele Claude, MD       TAKE these medications    calcium carbonate 750 MG chewable  tablet Commonly known as: TUMS EX Chew 1 tablet by mouth daily as needed for heartburn.   dicyclomine 20 MG tablet Commonly known as: BENTYL TAKE 1 TABLET BY MOUTH 4 TIMES DAILY - BEFORE MEALS AND AT BEDTIME.   fluticasone 50 MCG/ACT nasal spray Commonly known as: FLONASE SPRAY 2 SPRAYS INTO EACH NOSTRIL EVERY DAY   hydrochlorothiazide 25 MG tablet Commonly known as: HYDRODIURIL Take 1 tablet (25 mg total) by mouth daily.   hydrocortisone 2.5 % cream Apply 1  application topically daily as needed (Rash).   nabumetone 500 MG tablet Commonly known as: RELAFEN Take 2 tablets (1,000 mg total) by mouth 2 (two) times daily. For muscle and joint pain Started by: Mechele Claude, MD   norethindrone 0.35 MG tablet Commonly known as: MICRONOR Take 1 tablet (0.35 mg total) by mouth daily.   omeprazole 40 MG capsule Commonly known as: PRILOSEC Take 1 capsule (40 mg total) by mouth 2 (two) times daily. On an empty stomach   pregabalin 200 MG capsule Commonly known as: Lyrica Take 1 capsule (200 mg total) by mouth at bedtime.   solifenacin 10 MG tablet Commonly known as: VESICARE TAKE 1 TABLET (10 MG TOTAL) BY MOUTH DAILY. FOR BLADDER CONTROL   tiZANidine 4 MG tablet Commonly known as: ZANAFLEX Take 1 tablet (4 mg total) by mouth every 6 (six) hours as needed for muscle spasms. Started by: Mechele Claude, MD   Vitamin D (Ergocalciferol) 1.25 MG (50000 UNIT) Caps capsule Commonly known as: DRISDOL TAKE 1 CAPSULE (50,000 UNITS TOTAL) BY MOUTH EVERY 7 (SEVEN) DAYS         Follow-up: Return in about 3 months (around 08/21/2023), or if symptoms worsen or fail to improve.  Mechele Claude, M.D.

## 2023-05-22 LAB — URINE CULTURE

## 2023-05-23 ENCOUNTER — Telehealth: Payer: Self-pay | Admitting: Pharmacy Technician

## 2023-05-23 NOTE — Telephone Encounter (Signed)
Pharmacy Patient Advocate Encounter  Received notification from [facility] that Prior Authorization for Nabumetone 500MG  tablets has been APPROVED from 05/23/2023 to 05/22/2024.Marland Kitchen  PA #/Case ID/Reference #: [id number]

## 2023-05-23 NOTE — Progress Notes (Signed)
Hello Emmalyn,  Your lab result is normal and/or stable.Some minor variations that are not significant are commonly marked abnormal, but do not represent any medical problem for you.  Best regards, Gaelen Brager, M.D.

## 2023-08-27 ENCOUNTER — Telehealth (INDEPENDENT_AMBULATORY_CARE_PROVIDER_SITE_OTHER): Payer: Medicaid Other | Admitting: Family Medicine

## 2023-08-27 ENCOUNTER — Encounter: Payer: Self-pay | Admitting: Family Medicine

## 2023-08-27 DIAGNOSIS — J0110 Acute frontal sinusitis, unspecified: Secondary | ICD-10-CM | POA: Diagnosis not present

## 2023-08-27 MED ORDER — SULFAMETHOXAZOLE-TRIMETHOPRIM 800-160 MG PO TABS: 1.0000 | ORAL_TABLET | Freq: Two times a day (BID) | ORAL | 0 refills | Status: DC

## 2023-08-27 NOTE — Progress Notes (Signed)
Subjective:    Patient ID: Alyssa Arellano, female    DOB: 1976/08/19, 47 y.o.   MRN: [id number]   HPI: Alyssa Arellano is a 47 y.o. female presenting for Symptoms include congestion, facial pain, nasal congestion, non productive cough, post nasal drip and sinus pressure. There is no fever, chills, or sweats. Onset of symptoms was four weekss ago, gradually worsening since that time. Covid tests twice a week are negative. Using Mucinex sinus       05/21/2023    9:14 AM 11/19/2022    8:30 AM 05/21/2022    9:39 AM 04/05/2022   10:53 AM 11/22/2021    8:42 AM  Depression screen PHQ 2/9  Decreased Interest 0 0 0 0 0  Down, Depressed, Hopeless 0 0 0 0 0  PHQ - 2 Score 0 0 0 0 0  Altered sleeping   3 0 3  Tired, decreased energy   2 0 2  Change in appetite   0 0 0  Feeling bad or failure about yourself    0 0 0  Trouble concentrating   0 0 0  Moving slowly or fidgety/restless   0 0 0  Suicidal thoughts   0 0 0  PHQ-9 Score   5 0 5  Difficult doing work/chores   Not difficult at all Not difficult at all Not difficult at all     Relevant past medical, surgical, family and social history reviewed and updated as indicated.  Interim medical history since our last visit reviewed. Allergies and medications reviewed and updated.  ROS:  Review of Systems  Constitutional:  Negative for activity change, appetite change, chills and fever.  HENT:  Positive for congestion, postnasal drip, rhinorrhea and sinus pressure. Negative for ear discharge, ear pain, hearing loss, nosebleeds, sneezing and trouble swallowing.   Respiratory:  Negative for chest tightness and shortness of breath.   Cardiovascular:  Negative for chest pain and palpitations.  Skin:  Negative for rash.     Social History   Tobacco Use  Smoking Status Never  Smokeless Tobacco Never       Objective:     Wt Readings from Last 3 Encounters:  05/21/23 194 lb 6.4 oz (88.2 kg)  11/19/22 196 lb 9.6 oz (89.2 kg)   10/11/22 193 lb (87.5 kg)     Exam deferred. Video visit performed.   Assessment & Plan:   1. Acute non-recurrent frontal sinusitis     Meds ordered this encounter  Medications   sulfamethoxazole-trimethoprim (BACTRIM DS) 800-160 MG tablet    Sig: Take 1 tablet by mouth 2 (two) times daily. Until gone, for infection    Dispense:  20 tablet    Refill:  0    No orders of the defined types were placed in this encounter.     Diagnoses and all orders for this visit:  Acute non-recurrent frontal sinusitis  Other orders -     sulfamethoxazole-trimethoprim (BACTRIM DS) 800-160 MG tablet; Take 1 tablet by mouth 2 (two) times daily. Until gone, for infection    Virtual Visit  Note  I discussed the limitations, risks, security and privacy concerns of performing an evaluation and management service by video and the availability of in person appointments. The patient was identified with two identifiers. Pt.expressed understanding and agreed to proceed. Pt. Is at home. Dr. Darlyn Read is in his office.  Follow Up Instructions:   I discussed the assessment and treatment plan with the patient. The patient  was provided an opportunity to ask questions and all were answered. The patient agreed with the plan and demonstrated an understanding of the instructions.   The patient was advised to call back or seek an in-person evaluation if the symptoms worsen or if the condition fails to improve as anticipated.   Total minutes contact time: 11   Follow up plan: Return if symptoms worsen or fail to improve.  Mechele Claude, MD [place] [facility]

## 2023-09-11 ENCOUNTER — Other Ambulatory Visit: Payer: Self-pay | Admitting: Family Medicine

## 2023-11-15 ENCOUNTER — Other Ambulatory Visit: Payer: Self-pay | Admitting: Family Medicine

## 2023-11-15 DIAGNOSIS — E559 Vitamin D deficiency, unspecified: Secondary | ICD-10-CM

## 2023-11-20 ENCOUNTER — Other Ambulatory Visit: Payer: Self-pay

## 2023-11-20 ENCOUNTER — Ambulatory Visit: Payer: Medicaid Other | Admitting: Family Medicine

## 2023-11-20 ENCOUNTER — Ambulatory Visit (INDEPENDENT_AMBULATORY_CARE_PROVIDER_SITE_OTHER): Payer: Medicaid Other

## 2023-11-20 ENCOUNTER — Encounter: Payer: Self-pay | Admitting: Family Medicine

## 2023-11-20 VITALS — BP 126/86 | HR 47 | Temp 97.8°F | Ht 64.0 in | Wt 201.2 lb

## 2023-11-20 DIAGNOSIS — M25552 Pain in left hip: Secondary | ICD-10-CM | POA: Diagnosis not present

## 2023-11-20 DIAGNOSIS — E559 Vitamin D deficiency, unspecified: Secondary | ICD-10-CM | POA: Diagnosis not present

## 2023-11-20 DIAGNOSIS — Z79899 Other long term (current) drug therapy: Secondary | ICD-10-CM | POA: Diagnosis not present

## 2023-11-20 DIAGNOSIS — I10 Essential (primary) hypertension: Secondary | ICD-10-CM

## 2023-11-20 DIAGNOSIS — Z23 Encounter for immunization: Secondary | ICD-10-CM

## 2023-11-20 DIAGNOSIS — E782 Mixed hyperlipidemia: Secondary | ICD-10-CM

## 2023-11-20 MED ORDER — NABUMETONE 500 MG PO TABS: 1000.0000 mg | ORAL_TABLET | Freq: Two times a day (BID) | ORAL | 1 refills | Status: DC

## 2023-11-20 MED ORDER — PREGABALIN 300 MG PO CAPS: 300.0000 mg | ORAL_CAPSULE | Freq: Every day | ORAL | 1 refills | Status: DC

## 2023-11-20 MED ORDER — HYDROCHLOROTHIAZIDE 25 MG PO TABS: 25.0000 mg | ORAL_TABLET | Freq: Every day | ORAL | 3 refills | Status: DC

## 2023-11-20 NOTE — Addendum Note (Signed)
Addended by: Adella Hare B on: 11/20/2023 02:28 PM   Modules accepted: Orders

## 2023-11-20 NOTE — Progress Notes (Signed)
Subjective:  Patient ID: Alyssa Arellano, female    DOB: [birth year]  Age: 47 y.o. MRN: [id number]  CC: Medical Management of Chronic Issues   HPI Alyssa Arellano presents for  presents for  follow-up of hypertension. Patient has no history of headache chest pain or shortness of breath or recent cough. Patient also denies symptoms of TIA such as focal numbness or weakness. Patient denies side effects from medication. States taking it regularly.  Patient in for follow-up of GERD. Currently asymptomatic taking  PPI daily. There is no chest pain or heartburn. No hematemesis and no melena. No dysphagia or choking. Onset is remote. Progression is stable. Complicating factors, none.  Back and knee pain controlled with pregabalin - due pain contract today. Now having left hip pain. Can't sit too long. Walking, on feet all day causes pain. NKI . 8/10     11/20/2023    9:30 AM 11/20/2023    9:09 AM 05/21/2023    9:14 AM  Depression screen PHQ 2/9  Decreased Interest 1 0 0  Down, Depressed, Hopeless 1 0 0  PHQ - 2 Score 2 0 0  Altered sleeping 1    Tired, decreased energy 1    Change in appetite 1    Feeling bad or failure about yourself  1    Trouble concentrating 1    Moving slowly or fidgety/restless 0    Suicidal thoughts 0    PHQ-9 Score 7    Difficult doing work/chores Somewhat difficult      History Alyssa Arellano has a past medical history of Anxiety, GERD (gastroesophageal reflux disease), IBS (irritable bowel syndrome), PONV (postoperative nausea and vomiting), and Skin disorder.   She has a past surgical history that includes Cesarean section (2006); Ganglion cyst excision (Right, 12/18/2013); Tubal ligation; and Dilatation and curettage/hysteroscopy with minerva (N/A, 06/28/2021).   Her family history includes Cancer in her father, maternal grandmother, paternal grandfather, and paternal grandmother; Hypertension in her brother and brother; Seizures in her sister.She reports that she  has never smoked. She has never used smokeless tobacco. She reports that she does not drink alcohol and does not use drugs.    ROS Review of Systems  Constitutional: Negative.   HENT: Negative.    Eyes:  Negative for visual disturbance.  Respiratory:  Negative for shortness of breath.   Cardiovascular:  Negative for chest pain.  Gastrointestinal:  Negative for abdominal pain.  Musculoskeletal:  Positive for arthralgias (left hip).    Objective:  BP 126/86   Pulse (!) 47   Temp 97.8 F (36.6 C)   Ht 5\' 4"  (1.626 m)   Wt 201 lb 3.2 oz (91.3 kg)   SpO2 100%   BMI 34.54 kg/m   BP Readings from Last 3 Encounters:  11/20/23 126/86  05/21/23 111/74  11/19/22 118/79    Wt Readings from Last 3 Encounters:  11/20/23 201 lb 3.2 oz (91.3 kg)  05/21/23 194 lb 6.4 oz (88.2 kg)  11/19/22 196 lb 9.6 oz (89.2 kg)     Physical Exam Constitutional:      General: She is not in acute distress.    Appearance: She is well-developed.  Cardiovascular:     Rate and Rhythm: Normal rate and regular rhythm.  Pulmonary:     Breath sounds: Normal breath sounds.  Musculoskeletal:        General: Normal range of motion.  Skin:    General: Skin is warm and dry.  Neurological:  Mental Status: She is alert and oriented to person, place, and time.       Assessment & Plan:   Alyssa Arellano was seen today for medical management of chronic issues.  Diagnoses and all orders for this visit:  Essential hypertension -     CBC with Differential/Platelet -     CMP14+EGFR -     hydrochlorothiazide (HYDRODIURIL) 25 MG tablet; Take 1 tablet (25 mg total) by mouth daily.  Mixed hyperlipidemia -     Lipid panel  Vitamin D deficiency -     VITAMIN D 25 Hydroxy (Vit-D Deficiency, Fractures)  Need for influenza vaccination -     Flu vaccine trivalent PF, 6mos and older(Flulaval,Afluria,Fluarix,Fluzone)  Left hip pain -     DG HIP UNILAT W OR W/O PELVIS 2-3 VIEWS LEFT; Future  Other orders -      nabumetone (RELAFEN) 500 MG tablet; Take 2 tablets (1,000 mg total) by mouth 2 (two) times daily. For muscle and joint pain -     pregabalin (LYRICA) 300 MG capsule; Take 1 capsule (300 mg total) by mouth at bedtime.       I have discontinued Alyssa Arellano. Alyssa Arellano's sulfamethoxazole-trimethoprim. I have also changed her pregabalin. Additionally, I am having her maintain her calcium carbonate, hydrocortisone, dicyclomine, norethindrone, Vitamin D (Ergocalciferol), omeprazole, solifenacin, tiZANidine, fluticasone, hydrochlorothiazide, and nabumetone.  Allergies as of 11/20/2023       Reactions   Penicillins Rash   Reaction: 10 Years        Medication List        Accurate as of November 20, 2023  9:41 AM. If you have any questions, ask your nurse or doctor.          STOP taking these medications    sulfamethoxazole-trimethoprim 800-160 MG tablet Commonly known as: BACTRIM DS Stopped by: Alyssa Arellano       TAKE these medications    calcium carbonate 750 MG chewable tablet Commonly known as: TUMS EX Chew 1 tablet by mouth daily as needed for heartburn.   dicyclomine 20 MG tablet Commonly known as: BENTYL TAKE 1 TABLET BY MOUTH 4 TIMES DAILY - BEFORE MEALS AND AT BEDTIME.   fluticasone 50 MCG/ACT nasal spray Commonly known as: FLONASE SPRAY 2 SPRAYS INTO EACH NOSTRIL EVERY DAY   hydrochlorothiazide 25 MG tablet Commonly known as: HYDRODIURIL Take 1 tablet (25 mg total) by mouth daily.   hydrocortisone 2.5 % cream Apply 1 application topically daily as needed (Rash).   nabumetone 500 MG tablet Commonly known as: RELAFEN Take 2 tablets (1,000 mg total) by mouth 2 (two) times daily. For muscle and joint pain   norethindrone 0.35 MG tablet Commonly known as: MICRONOR Take 1 tablet (0.35 mg total) by mouth daily.   omeprazole 40 MG capsule Commonly known as: PRILOSEC Take 1 capsule (40 mg total) by mouth 2 (two) times daily. On an empty stomach   pregabalin  300 MG capsule Commonly known as: Lyrica Take 1 capsule (300 mg total) by mouth at bedtime. What changed:  medication strength how much to take Changed by: Alyssa Arellano   solifenacin 10 MG tablet Commonly known as: VESICARE TAKE 1 TABLET (10 MG TOTAL) BY MOUTH DAILY. FOR BLADDER CONTROL   tiZANidine 4 MG tablet Commonly known as: ZANAFLEX Take 1 tablet (4 mg total) by mouth every 6 (six) hours as needed for muscle spasms.   Vitamin D (Ergocalciferol) 1.25 MG (50000 UNIT) Caps capsule Commonly known as: DRISDOL TAKE 1 CAPSULE (50,000  UNITS TOTAL) BY MOUTH EVERY 7 (SEVEN) DAYS         Follow-up: Return in about 6 months (around 05/20/2024) for Pain, hypertension.  Mechele Claude, M.D.

## 2023-11-21 LAB — CBC WITH DIFFERENTIAL/PLATELET
Basophils Absolute: 0 10*3/uL (ref 0.0–0.2)
Basos: 1 *Unknown
EOS (ABSOLUTE): 0.2 10*3/uL (ref 0.0–0.4)
Eos: 3 *Unknown
Hematocrit: 45.3 *Unknown (ref 34.0–46.6)
Hemoglobin: 14.1 g/dL (ref 11.1–15.9)
Immature Grans (Abs): 0 10*3/uL (ref 0.0–0.1)
Immature Granulocytes: 0 *Unknown
Lymphocytes Absolute: 2 10*3/uL (ref 0.7–3.1)
Lymphs: 34 *Unknown
MCH: 27.5 pg (ref 26.6–33.0)
MCHC: 31.1 g/dL — ABNORMAL LOW (ref 31.5–35.7)
MCV: 88 fL (ref 79–97)
Monocytes Absolute: 0.5 10*3/uL (ref 0.1–0.9)
Monocytes: 8 *Unknown
Neutrophils Absolute: 3.2 10*3/uL (ref 1.4–7.0)
Neutrophils: 54 *Unknown
Platelets: 238 10*3/uL (ref 150–450)
RBC: 5.13 x10E6/uL (ref 3.77–5.28)
RDW: 13.1 *Unknown (ref 11.7–15.4)
WBC: 5.9 10*3/uL (ref 3.4–10.8)

## 2023-11-21 LAB — CMP14+EGFR
ALT: 16 IU/L (ref 0–32)
AST: 17 IU/L (ref 0–40)
Albumin: 4.1 g/dL (ref 3.9–4.9)
Alkaline Phosphatase: 66 IU/L (ref 44–121)
BUN/Creatinine Ratio: 16 *Unknown (ref 9–23)
BUN: 16 mg/dL (ref 6–24)
Bilirubin Total: 0.4 mg/dL (ref 0.0–1.2)
CO2: 27 mmol/L (ref 20–29)
Calcium: 9.1 mg/dL (ref 8.7–10.2)
Chloride: 100 mmol/L (ref 96–106)
Creatinine, Ser: 0.97 mg/dL (ref 0.57–1.00)
Globulin, Total: 2.8 g/dL (ref 1.5–4.5)
Glucose: 87 mg/dL (ref 70–99)
Potassium: 4 mmol/L (ref 3.5–5.2)
Sodium: 142 mmol/L (ref 134–144)
Total Protein: 6.9 g/dL (ref 6.0–8.5)
eGFR: 73 mL/min/{1.73_m2} (ref >59)

## 2023-11-21 LAB — LIPID PANEL
Chol/HDL Ratio: 3.7 ratio (ref 0.0–4.4)
Cholesterol, Total: 167 mg/dL (ref 100–199)
HDL: 45 mg/dL (ref >39)
LDL Chol Calc (NIH): 103 mg/dL — ABNORMAL HIGH (ref 0–99)
Triglycerides: 101 mg/dL (ref 0–149)
VLDL Cholesterol Cal: 19 mg/dL (ref 5–40)

## 2023-11-21 LAB — VITAMIN D 25 HYDROXY (VIT D DEFICIENCY, FRACTURES): Vit D, 25-Hydroxy: 78.1 ng/mL (ref 30.0–100.0)

## 2023-11-21 NOTE — Progress Notes (Signed)
Hello Emmalyn,  Your lab result is normal and/or stable.Some minor variations that are not significant are commonly marked abnormal, but do not represent any medical problem for you.  Best regards, Gaelen Brager, M.D.

## 2023-11-22 LAB — TOXASSURE SELECT 13 (MW), URINE

## 2024-01-14 ENCOUNTER — Other Ambulatory Visit: Payer: Self-pay | Admitting: Obstetrics & Gynecology

## 2024-05-21 ENCOUNTER — Encounter: Payer: Self-pay | Admitting: Family Medicine

## 2024-05-21 ENCOUNTER — Ambulatory Visit: Payer: Medicaid Other | Admitting: Family Medicine

## 2024-05-21 VITALS — Ht 64.0 in

## 2024-05-21 DIAGNOSIS — K219 Gastro-esophageal reflux disease without esophagitis: Secondary | ICD-10-CM | POA: Diagnosis not present

## 2024-05-21 DIAGNOSIS — M5416 Radiculopathy, lumbar region: Secondary | ICD-10-CM

## 2024-05-21 DIAGNOSIS — I10 Essential (primary) hypertension: Secondary | ICD-10-CM | POA: Diagnosis not present

## 2024-05-21 DIAGNOSIS — R3589 Other polyuria: Secondary | ICD-10-CM

## 2024-05-21 DIAGNOSIS — K582 Mixed irritable bowel syndrome: Secondary | ICD-10-CM

## 2024-05-21 DIAGNOSIS — N3941 Urge incontinence: Secondary | ICD-10-CM

## 2024-05-21 LAB — URINALYSIS, COMPLETE
Bilirubin, UA: NEGATIVE
Glucose, UA: NEGATIVE
Ketones, UA: NEGATIVE
Leukocytes,UA: NEGATIVE
Nitrite, UA: NEGATIVE
Protein,UA: NEGATIVE
Specific Gravity, UA: 1.01 (ref 1.005–1.030)
Urobilinogen, Ur: 0.2 mg/dL (ref 0.2–1.0)
pH, UA: 7.5 (ref 5.0–7.5)

## 2024-05-21 LAB — MICROSCOPIC EXAMINATION

## 2024-05-21 MED ORDER — NABUMETONE 500 MG PO TABS: 1000.0000 mg | ORAL_TABLET | Freq: Two times a day (BID) | ORAL | 1 refills | Status: DC

## 2024-05-21 MED ORDER — DICYCLOMINE HCL 20 MG PO TABS: ORAL_TABLET | ORAL | 1 refills | Status: DC

## 2024-05-21 MED ORDER — HYDROCHLOROTHIAZIDE 25 MG PO TABS
25.0000 mg | ORAL_TABLET | Freq: Every day | ORAL | 3 refills | Status: AC
Start: 2024-05-21 — End: ?

## 2024-05-21 MED ORDER — OMEPRAZOLE 40 MG PO CPDR: 40.0000 mg | DELAYED_RELEASE_CAPSULE | Freq: Two times a day (BID) | ORAL | 3 refills | Status: AC

## 2024-05-21 MED ORDER — TIZANIDINE HCL 4 MG PO TABS
4.0000 mg | ORAL_TABLET | Freq: Four times a day (QID) | ORAL | 2 refills | Status: AC | PRN
Start: 2024-05-21 — End: ?

## 2024-05-21 MED ORDER — FLUTICASONE PROPIONATE 50 MCG/ACT NA SUSP: NASAL | 2 refills | Status: AC

## 2024-05-21 MED ORDER — SOLIFENACIN SUCCINATE 10 MG PO TABS
ORAL_TABLET | ORAL | 3 refills | Status: AC
Start: 2024-05-21 — End: ?

## 2024-05-21 MED ORDER — PREGABALIN 300 MG PO CAPS: 300.0000 mg | ORAL_CAPSULE | Freq: Every day | ORAL | 1 refills | Status: DC

## 2024-05-21 NOTE — Progress Notes (Signed)
 Subjective:  Patient ID: Alyssa Arellano, female    DOB: 10-19-1976  Age: 48 y.o. MRN: [id number]  CC: Medical Management of Chronic Issues (Colon/ agrees) and Ear Pain (Ears were hurting a few weeks ago but now they have stopped. )   HPI Alyssa Arellano presents for  follow-up of hypertension. Patient has no history of headache chest pain or shortness of breath or recent cough. Patient also denies symptoms of TIA such as focal numbness or weakness. Patient denies side effects from medication. States taking it regularly.  Lyrica  helping back and leg pain.  She does note that it down to work on charge from [employer] any side effects including drowsiness dizziness Patient in for follow-up of GERD. Currently asymptomatic taking  PPI daily. There is no chest pain or heartburn. No hematemesis and no melena. No dysphagia or choking. Onset is remote. Progression is stable. Complicating factors, none.  Irritable bowel currently well-controlled with diet and medication regimen.  Vesicare  does significantly reduce her urge incontinence. History Alyssa Arellano has a past medical history of Anxiety, GERD (gastroesophageal reflux disease), IBS (irritable bowel syndrome), PONV (postoperative nausea and vomiting), and Skin disorder.   She has a past surgical history that includes Cesarean section (2006); Ganglion cyst excision (Right, 12/18/2013); Tubal ligation; and Dilatation and curettage/hysteroscopy with minerva (N/A, 06/28/2021).   Her family history includes Cancer in her father, maternal grandmother, paternal grandfather, and paternal grandmother; Hypertension in her brother and brother; Seizures in her sister.She reports that she has never smoked. She has never used smokeless tobacco. She reports that she does not drink alcohol and does not use drugs.  Current Outpatient Medications on File Prior to Visit  Medication Sig Dispense Refill   calcium carbonate (TUMS EX) 750 MG chewable tablet Chew 1 tablet by  mouth daily as needed for heartburn.     hydrocortisone 2.5 % cream Apply 1 application topically daily as needed (Rash).     norethindrone  (MICRONOR ) 0.35 MG tablet TAKE 1 TABLET BY MOUTH EVERY DAY (Patient not taking: Reported on 05/21/2024) 84 tablet 3   No current facility-administered medications on file prior to visit.    ROS Review of Systems  Constitutional: Negative.   HENT: Negative.    Eyes:  Negative for visual disturbance.  Respiratory:  Negative for shortness of breath.   Cardiovascular:  Negative for chest pain.  Gastrointestinal:  Negative for abdominal pain.  Musculoskeletal:  Negative for arthralgias.    Objective:  Ht 5' 4 (1.626 m)   BMI 34.54 kg/m   BP Readings from Last 3 Encounters:  11/20/23 126/86  05/21/23 111/74  11/19/22 118/79    Wt Readings from Last 3 Encounters:  11/20/23 201 lb 3.2 oz (91.3 kg)  05/21/23 194 lb 6.4 oz (88.2 kg)  11/19/22 196 lb 9.6 oz (89.2 kg)     Physical Exam Constitutional:      General: She is not in acute distress.    Appearance: She is well-developed.   Cardiovascular:     Rate and Rhythm: Normal rate and regular rhythm.  Pulmonary:     Breath sounds: Normal breath sounds.   Musculoskeletal:        General: Normal range of motion.   Skin:    General: Skin is warm and dry.   Neurological:     Mental Status: She is alert and oriented to person, place, and time.       Assessment & Plan:  Essential hypertension -     hydroCHLOROthiazide ;  Take 1 tablet (25 mg total) by mouth daily.  Dispense: 90 tablet; Refill: 3  Gastroesophageal reflux disease without esophagitis -     Omeprazole ; Take 1 capsule (40 mg total) by mouth 2 (two) times daily. On an empty stomach  Dispense: 180 capsule; Refill: 3  Urge incontinence -     Solifenacin  Succinate; TAKE 1 TABLET (10 MG TOTAL) BY MOUTH DAILY. FOR BLADDER CONTROL  Dispense: 90 tablet; Refill: 3 -     Urine Culture -     Urinalysis, Complete -      Microscopic Examination  Other polyuria -     Solifenacin  Succinate; TAKE 1 TABLET (10 MG TOTAL) BY MOUTH DAILY. FOR BLADDER CONTROL  Dispense: 90 tablet; Refill: 3 -     Urine Culture -     Urinalysis, Complete -     Microscopic Examination  Irritable bowel syndrome with both constipation and diarrhea -     Dicyclomine  HCl; TAKE 1 TABLET BY MOUTH 4 TIMES DAILY - BEFORE MEALS AND AT BEDTIME.  Dispense: 360 tablet; Refill: 1  Lumbar radiculopathy, chronic -     Pregabalin ; Take 1 capsule (300 mg total) by mouth at bedtime.  Dispense: 90 capsule; Refill: 1 -     tiZANidine  HCl; Take 1 tablet (4 mg total) by mouth every 6 (six) hours as needed for muscle spasms.  Dispense: 120 tablet; Refill: 2 -     Nabumetone ; Take 2 tablets (1,000 mg total) by mouth 2 (two) times daily. For muscle and joint pain  Dispense: 360 tablet; Refill: 1  Other orders -     Fluticasone  Propionate; SPRAY 2 SPRAYS INTO EACH NOSTRIL EVERY DAY  Dispense: 48 mL; Refill: 2    Allergies as of 05/21/2024       Reactions   Penicillins Rash   Reaction: 10 Years        Medication List        Accurate as of May 21, 2024 11:59 PM. If you have any questions, ask your nurse or doctor.          calcium carbonate 750 MG chewable tablet Commonly known as: TUMS EX Chew 1 tablet by mouth daily as needed for heartburn.   dicyclomine  20 MG tablet Commonly known as: BENTYL  TAKE 1 TABLET BY MOUTH 4 TIMES DAILY - BEFORE MEALS AND AT BEDTIME.   fluticasone  50 MCG/ACT nasal spray Commonly known as: FLONASE  SPRAY 2 SPRAYS INTO EACH NOSTRIL EVERY DAY   hydrochlorothiazide  25 MG tablet Commonly known as: HYDRODIURIL  Take 1 tablet (25 mg total) by mouth daily.   hydrocortisone 2.5 % cream Apply 1 application topically daily as needed (Rash).   nabumetone  500 MG tablet Commonly known as: RELAFEN  Take 2 tablets (1,000 mg total) by mouth 2 (two) times daily. For muscle and joint pain   norethindrone  0.35 MG  tablet Commonly known as: MICRONOR  TAKE 1 TABLET BY MOUTH EVERY DAY   omeprazole  40 MG capsule Commonly known as: PRILOSEC Take 1 capsule (40 mg total) by mouth 2 (two) times daily. On an empty stomach   pregabalin  300 MG capsule Commonly known as: Lyrica  Take 1 capsule (300 mg total) by mouth at bedtime.   solifenacin  10 MG tablet Commonly known as: VESICARE  TAKE 1 TABLET (10 MG TOTAL) BY MOUTH DAILY. FOR BLADDER CONTROL   tiZANidine  4 MG tablet Commonly known as: ZANAFLEX  Take 1 tablet (4 mg total) by mouth every 6 (six) hours as needed for muscle spasms.  Follow-up: Return in about 6 months (around 11/20/2024) for [employer] physical.  Roise Cleaver, M.D.

## 2024-05-21 NOTE — Patient Instructions (Signed)

## 2024-05-22 ENCOUNTER — Encounter: Payer: Self-pay | Admitting: Family Medicine

## 2024-05-22 LAB — URINE CULTURE

## 2024-05-26 ENCOUNTER — Ambulatory Visit: Payer: Self-pay | Admitting: Family Medicine

## 2024-05-26 NOTE — Progress Notes (Signed)
Hello Alyssa Arellano,  Your lab result is normal and/or stable.Some minor variations that are not significant are commonly marked abnormal, but do not represent any medical problem for you.  Best regards, Gaelen Brager, M.D.

## 2024-08-31 ENCOUNTER — Encounter: Payer: Self-pay | Admitting: Nurse Practitioner

## 2024-08-31 ENCOUNTER — Ambulatory Visit: Payer: Self-pay

## 2024-08-31 ENCOUNTER — Ambulatory Visit: Admitting: Nurse Practitioner

## 2024-08-31 ENCOUNTER — Ambulatory Visit (INDEPENDENT_AMBULATORY_CARE_PROVIDER_SITE_OTHER)

## 2024-08-31 VITALS — BP 115/76 | HR 66 | Temp 97.5°F | Ht 64.0 in | Wt 189.8 lb

## 2024-08-31 DIAGNOSIS — K582 Mixed irritable bowel syndrome: Secondary | ICD-10-CM

## 2024-08-31 DIAGNOSIS — R1084 Generalized abdominal pain: Secondary | ICD-10-CM

## 2024-08-31 MED ORDER — POLYETHYLENE GLYCOL 3350 17 GM/SCOOP PO POWD: 17.0000 g | Freq: Every day | ORAL | 0 refills | Status: AC

## 2024-08-31 NOTE — Telephone Encounter (Signed)
 FYI Only or Action Required?: FYI only for provider.  Patient was last seen in primary care on 05/21/2024 by Zollie Lowers, MD.  Called Nurse Triage reporting Pain.  Symptoms began yesterday.  Interventions attempted: OTC medications: tylenol .  Symptoms are: unchanged.  Triage Disposition: See HCP Within 4 Hours (Or PCP Triage)  Patient/caregiver understands and will follow disposition?: Yes, will follow disposition  Copied from CRM [id number]. Topic: Clinical - Red Word Triage >> Aug 31, 2024 11:14 AM Larissa RAMAN wrote: [facility] that prompted transfer to Nurse Triage: back, abdominal, and rt side pain Reason for Disposition  [1] SEVERE pain (e.g., excruciating, unable to do any normal activities) AND [2] not improved 2 hours after pain medicine  Answer Assessment - Initial Assessment Questions 1. ONSET: When did the muscle aches or body pains start?      yesterday 2. LOCATION: What part of your body is hurting? (e.g., entire body, arms, legs)      Back, sides, abd 3. SEVERITY: How bad is the pain? (Scale 1-10; or mild, moderate, severe)     10 4. CAUSE: What do you think is causing the pains?     Ate onion yesterday 5. FEVER: Do you have a fever? If Yes, ask: What is your temperature, how was it measured, and  when did it start?      denies 6. OTHER SYMPTOMS: Do you have any other symptoms? (e.g., chest pain, cold or flu symptoms, rash, weakness, weight loss)     Denies diarrhea. + N/V 7. PREGNANCY: Is there any chance you are pregnant? When was your last menstrual period? Not unless it is in my tubes 8. TRAVEL: Have you traveled out of the country in the last month? (e.g., exposures, travel history)     Denies  Pt states that she takes diuretics so she is unsure if she is having GU s/s.  Protocols used: Muscle Aches and Body Pain-A-AH

## 2024-08-31 NOTE — Telephone Encounter (Signed)
 Appt made.

## 2024-08-31 NOTE — Progress Notes (Signed)
 Subjective:  Patient ID: Alyssa Arellano, female    DOB: 1976-04-07, 48 y.o.   MRN: [id number]  Patient Care Team: Zollie Lowers, MD as PCP - General (Family Medicine)   Chief Complaint:  Abdominal Pain (Symptoms started yesterday ), Flank Pain, and Back Pain (Upper back pain)   HPI: Alyssa Arellano is a 48 y.o. female presenting on 08/31/2024 for Abdominal Pain (Symptoms started yesterday ), Flank Pain, and Back Pain (Upper back pain)   Discussed the use of AI scribe software for clinical note transcription with the patient, who gave verbal consent to proceed.  History of Present Illness Alyssa Arellano is a 48 year old female with irritable bowel syndrome who presents with abdominal and back pain.  She has been experiencing abdominal and back pain since yesterday. The abdominal pain is located in the right upper quadrant and radiates to her side, while the back pain is in the mid-cervical region, extending down the middle of her spine. The pain began after consuming a meal that included rice, shrimp, chicken, onions, and cheese.  She has a history of irritable bowel syndrome and typically experiences diarrhea, although she currently denies having diarrhea. Her bowel movements are described as 'foam', and she sometimes strains but generally has diarrhea. She takes Bentyl  20 mg daily, calcium carbonate 750 mg as needed, and omeprazole  40 mg daily for her IBS. She has not had a recent  but adheres to her prescribed medications.  No discomfort or burning sensation during urination and no blood in her stool. Her last menstrual cycle was a couple of weeks ago, and she has had her tubes tied since 2006.      Relevant past medical, surgical, family, and social history reviewed and updated as indicated.  Allergies and medications reviewed and updated. Data reviewed: Chart in Epic.   Past Medical History:  Diagnosis Date   Anxiety    GERD (gastroesophageal reflux disease)    IBS  (irritable bowel syndrome)    PONV (postoperative nausea and vomiting)    Skin disorder    possibly psoriosis, but pt is unsure. Causes red, spotchy, dry spots on body when it gets cold.    Past Surgical History:  Procedure Laterality Date   CESAREAN SECTION  2006   [facility]-Dr. Olegario   DILATATION AND CURETTAGE/HYSTEROSCOPY WITH MINERVA N/A 06/28/2021   Procedure: DILATATION AND CURETTAGE/HYSTEROSCOPY WITH MINERVA;  Surgeon: Jayne Vonn DEL, MD;  Location: [facility];  Service: Gynecology;  Laterality: N/A;   GANGLION CYST EXCISION Right 12/18/2013   Procedure: REMOVAL GANGLION CYST RIGHT FOOT;  Surgeon: Taft FORBES Minerva, MD;  Location: [facility];  Service: Orthopedics;  Laterality: Right;   TUBAL LIGATION      Social History   Socioeconomic History   Marital status: Single    Spouse name: Not on file   Number of children: Not on file   Years of education: Not on file   Highest education level: Not on file  Occupational History   Not on file  Tobacco Use   Smoking status: Never   Smokeless tobacco: Never  Vaping Use   Vaping status: Never Used  Substance and Sexual Activity   Alcohol use: No   Drug use: No   Sexual activity: Yes    Birth control/protection: Surgical    Comment: tubal & ablation  Other Topics Concern   Not on file  Social History Narrative   Lives with ex    Right handed  Caffeine: 3-4 cups of coffee, tea and soda a day   Social Drivers of Corporate investment banker Strain: Low Risk  (12/27/2020)   Overall Financial Resource Strain (CARDIA)    Difficulty of Paying Living Expenses: Not very hard  Food Insecurity: No Food Insecurity (12/27/2020)   Hunger Vital Sign    Worried About Running Out of Food in the Last Year: Never true    Ran Out of Food in the Last Year: Never true  Transportation Needs: No Transportation Needs (12/27/2020)   PRAPARE - Administrator, Civil Service (Medical): No    Lack of Transportation (Non-Medical): No   Physical Activity: Inactive (12/27/2020)   Exercise Vital Sign    Days of Exercise per Week: 0 days    Minutes of Exercise per Session: 0 min  Stress: No Stress Concern Present (12/27/2020)   [employer] of Occupational Health - Occupational Stress Questionnaire    Feeling of Stress : Not at all  Social Connections: Socially Isolated (12/27/2020)   Social Connection and Isolation Panel    Frequency of Communication with Friends and Family: Three times a week    Frequency of Social Gatherings with Friends and Family: Once a week    Attends Religious Services: Never    Database administrator or Organizations: No    Attends Banker Meetings: Never    Marital Status: Never married  Intimate Partner Violence: Not At Risk (12/27/2020)   Humiliation, Afraid, Rape, and Kick questionnaire    Fear of Current or Ex-Partner: No    Emotionally Abused: No    Physically Abused: No    Sexually Abused: No    Outpatient Encounter Medications as of 08/31/2024  Medication Sig   calcium carbonate (TUMS EX) 750 MG chewable tablet Chew 1 tablet by mouth daily as needed for heartburn.   dicyclomine  (BENTYL ) 20 MG tablet TAKE 1 TABLET BY MOUTH 4 TIMES DAILY - BEFORE MEALS AND AT BEDTIME.   fluticasone  (FLONASE ) 50 MCG/ACT nasal spray SPRAY 2 SPRAYS INTO EACH NOSTRIL EVERY DAY   hydrochlorothiazide  (HYDRODIURIL ) 25 MG tablet Take 1 tablet (25 mg total) by mouth daily.   hydrocortisone 2.5 % cream Apply 1 application topically daily as needed (Rash).   nabumetone  (RELAFEN ) 500 MG tablet Take 2 tablets (1,000 mg total) by mouth 2 (two) times daily. For muscle and joint pain   omeprazole  (PRILOSEC) 40 MG capsule Take 1 capsule (40 mg total) by mouth 2 (two) times daily. On an empty stomach   polyethylene glycol powder (GLYCOLAX /MIRALAX ) 17 GM/SCOOP powder Take 17 g by mouth daily. Dissolve 1 capful (17g) in 4-8 ounces of liquid and take by mouth daily.   pregabalin  (LYRICA ) 300 MG capsule Take 1  capsule (300 mg total) by mouth at bedtime.   solifenacin  (VESICARE ) 10 MG tablet TAKE 1 TABLET (10 MG TOTAL) BY MOUTH DAILY. FOR BLADDER CONTROL   tiZANidine  (ZANAFLEX ) 4 MG tablet Take 1 tablet (4 mg total) by mouth every 6 (six) hours as needed for muscle spasms.   norethindrone  (MICRONOR ) 0.35 MG tablet TAKE 1 TABLET BY MOUTH EVERY DAY (Patient not taking: Reported on 08/31/2024)   No facility-administered encounter medications on file as of 08/31/2024.    Allergies  Allergen Reactions   Penicillins Rash    Reaction: 10 Years    Pertinent ROS per HPI, otherwise unremarkable      Objective:  BP 115/76   Pulse 66   Temp (!) 97.5 F (36.4  C) (Temporal)   Ht 5' 4 (1.626 m)   Wt 189 lb 12.8 oz (86.1 kg)   SpO2 99%   BMI 32.58 kg/m    Wt Readings from Last 3 Encounters:  08/31/24 189 lb 12.8 oz (86.1 kg)  11/20/23 201 lb 3.2 oz (91.3 kg)  05/21/23 194 lb 6.4 oz (88.2 kg)    Physical Exam Vitals reviewed.  Constitutional:      Appearance: She is obese.  HENT:     Head: Normocephalic and atraumatic.     Nose: Nose normal.     Mouth/Throat:     Mouth: Mucous membranes are moist.  Eyes:     Extraocular Movements: Extraocular movements intact.     Conjunctiva/sclera: Conjunctivae normal.     Pupils: Pupils are equal, round, and reactive to light.  Cardiovascular:     Heart sounds: Normal heart sounds.  Pulmonary:     Effort: Pulmonary effort is normal.     Breath sounds: Normal breath sounds.  Abdominal:     General: Bowel sounds are normal.     Palpations: Abdomen is soft.     Tenderness: There is abdominal tenderness. There is no right CVA tenderness or left CVA tenderness.  Musculoskeletal:        General: Normal range of motion.     Right lower leg: No edema.     Left lower leg: No edema.  Skin:    General: Skin is warm and dry.     Findings: No rash.  Neurological:     Mental Status: She is oriented to person, place, and time.  Psychiatric:        Mood  and Affect: Mood normal.        Behavior: Behavior normal.        Thought Content: Thought content normal.        Judgment: Judgment normal.    Physical Exam ABDOMEN: Pain on palpation of abdomen.     Results for orders placed or performed in visit on 05/21/24  Microscopic Examination   Collection Time: 05/21/24  9:14 AM   Urine  Result Value Ref Range   WBC, UA 0-5 0 - 5 /hpf   RBC, Urine 0-2 0 - 2 /hpf   Epithelial Cells (non renal) 0-10 0 - 10 /hpf   Bacteria, UA Few None seen/Few  Urinalysis, Complete   Collection Time: 05/21/24  9:14 AM  Result Value Ref Range   Specific Gravity, UA 1.010 1.005 - 1.030   pH, UA 7.5 5.0 - 7.5   Color, UA Yellow Yellow   Appearance Ur Clear Clear   Leukocytes,UA Negative Negative   Protein,UA Negative Negative/Trace   Glucose, UA Negative Negative   Ketones, UA Negative Negative   RBC, UA Trace (A) Negative   Bilirubin, UA Negative Negative   Urobilinogen, Ur 0.2 0.2 - 1.0 mg/dL   Nitrite, UA Negative Negative   Microscopic Examination See below:   Urine Culture   Collection Time: 05/21/24 10:45 AM   Specimen: Urine   UR  Result Value Ref Range   Urine Culture, Routine Final report    Organism ID, Bacteria Comment        Pertinent labs & imaging results that were available during my care of the patient were reviewed by me and considered in my medical decision making.  Assessment & Plan:  Alyssa Arellano was seen today for abdominal pain, flank pain and back pain.  Diagnoses and all orders for this visit:  Generalized abdominal  pain -     DG Abd 1 View -     polyethylene glycol powder (GLYCOLAX /MIRALAX ) 17 GM/SCOOP powder; Take 17 g by mouth daily. Dissolve 1 capful (17g) in 4-8 ounces of liquid and take by mouth daily.  Irritable bowel syndrome with both constipation and diarrhea -     DG Abd 1 View -     polyethylene glycol powder (GLYCOLAX /MIRALAX ) 17 GM/SCOOP powder; Take 17 g by mouth daily. Dissolve 1 capful (17g) in 4-8  ounces of liquid and take by mouth daily.    Alyssa Arellano is a 48 year old Caucasian female seen today for IBS with constipation, no acute distress Assessment and Plan Assessment & Plan Irritable bowel syndrome (IBS) flare IBS flare with abdominal discomfort after consuming onions. Onions identified as a trigger. No diarrhea or constipation. On Bentyl  and omeprazole . - Advise to avoid onions and other known dietary triggers. - Continue Bentyl  20 mg daily. - Continue omeprazole  40 mg daily. - KUB Order abdominal x-ray to rule out other causes of generalized abdominal tenderness.  Generalized abdominal pain Generalized abdominal pain with diffuse tenderness. No urinary symptoms or blood in stool. Recent foamy bowel movement. History of IBS. - Order abdominal x-ray to evaluate for potential underlying causes of generalized abdominal pain. MiraLAX  17 g daily, increase hydration   Continue all other maintenance medications.  Follow up plan: Return if symptoms worsen or fail to improve.   Continue healthy lifestyle choices, including diet (rich in fruits, vegetables, and lean proteins, and low in salt and simple carbohydrates) and exercise (at least 30 minutes of moderate physical activity daily).  Educational handout given for  Abdominal Pain, Adult  Many things can cause belly (abdominal) pain. In most cases, belly pain is not a serious problem and can be watched and treated at home. But in some cases, it can be serious. Your doctor will try to find the cause of your belly pain. Follow these instructions at home: Medicines Take over-the-counter and prescription medicines only as told by your doctor. Do not take medicines that help you poop (laxatives) unless told by your doctor. General instructions Watch your belly pain for any changes. Tell your doctor if the pain gets worse. Drink enough fluid to keep your pee (urine) pale yellow. Contact a doctor if: Your belly pain changes or  gets worse. You have very bad cramping or bloating in your belly. You vomit. Your pain gets worse with meals, after eating, or with certain foods. You have trouble pooping or have watery poop for more than 2-3 days. You are not hungry, or you lose weight without trying. You have signs of not getting enough fluid or water (dehydration). These may include: Dark pee, very little pee, or no pee. Cracked lips or dry mouth. Feeling sleepy or weak. You have pain when you pee or poop. Your belly pain wakes you up at night. You have blood in your pee. You have a fever. Get help right away if: You cannot stop vomiting. Your pain is only in one part of your belly, like on the right side. You have bloody or black poop, or poop that looks like tar. You have trouble breathing. You have chest pain. These symptoms may be an emergency. Get help right away. Call 911. Do not wait to see if the symptoms will go away. Do not drive yourself to the hospital. This information is not intended to replace advice given to you by your health care provider. Make sure you discuss any questions  you have with your health care provider. Document Revised: 09/12/2022 Document Reviewed: 09/12/2022 Elsevier Patient Education  2024 Elsevier Inc. Irritable Bowel Syndrome, Adult  Irritable bowel syndrome (IBS) is a group of symptoms that affects the organs responsible for digestion (gastrointestinal tract, or GI tract). IBS is not one specific disease. To regulate how the GI tract works, the body sends signals back and forth between the intestines and the brain. If you have IBS, there may be a problem with these signals. As a result, the GI tract does not function normally. The intestines may become more sensitive and overreact to certain things. This may be especially true when you eat certain foods or when you are under stress. There are four main types of IBS. These may be determined based on the consistency of your stool  (feces): IBS with mostly (predominance of) diarrhea. IBS with predominance of constipation. IBS with mixed bowel habits. This includes both diarrhea and constipation. IBS unclassified. This includes IBS that cannot be categorized into one of the other three main types. It is important to know which type of IBS you have. Certain treatments are more likely to be helpful for certain types of IBS. What are the causes? The exact cause of IBS is not known. What increases the risk? You may have a higher risk for IBS if you: Are female. Are younger than 40 years. Have a family history of IBS. Have a mental health condition, such as depression, anxiety, or post-traumatic stress disorder. Have had a bacterial infection of your GI tract. What are the signs or symptoms? Symptoms of IBS vary from person to person. The main symptom is abdominal pain or discomfort. Other symptoms usually include one or more of the following: Diarrhea, constipation, or both. Swelling or bloating in the abdomen. Feeling full after eating a small or regular-sized meal. Frequent gas. Mucus in the stool. A feeling of having more stool left after a bowel movement. Symptoms tend to come and go. They may be triggered by stress, mental health conditions, or certain foods. How is this diagnosed? This condition may be diagnosed based on a physical exam, your medical history, and your symptoms. You may have tests, such as: Blood tests. Stool test. Colonoscopy. This is a procedure in which your GI tract is viewed with a long, thin, flexible tube. How is this treated? There is no cure for IBS, but treatment can help relieve symptoms. Treatment depends on the type of IBS you have, and may include: Changes to your diet, such as: Avoiding foods that cause symptoms. Drinking more water. Following a low-FODMAP (fermentable oligosaccharides, disaccharides, monosaccharides, and polyols) diet for up to 6 weeks, or as told by your  health care provider. FODMAPs are sugars that are hard for some people to digest. Eating more fiber. Eating small meals at the same times every day. Medicines. These may include: Fiber supplements, if you have constipation. Medicine to control diarrhea (antidiarrheal medicines). Medicine to help control muscle tightening (spasms) in your GI tract (antispasmodic medicines). Medicines to help with mental health conditions, such as antidepressants. Talk therapy or counseling. Working with a dietitian to help create a food plan that is right for you. Managing your stress. Follow these instructions at home: Eating and drinking  Eat a healthy diet. Eat 5-6 small meals a day. Try to eat meals at about the same times each day. Do not eat large meals. Gradually eat more fiber-rich foods. These include whole grains, fruits, and vegetables. This may be especially  helpful if you have IBS with constipation. Eat a diet low in FODMAPs. You may need to avoid foods such as citrus fruits, cabbage, garlic, and onions. Drink enough fluid to keep your urine pale yellow. Keep a journal of foods that seem to trigger symptoms. Avoid foods and drinks that: Contain added sugar. Make your symptoms worse. These may include dairy products, caffeinated drinks, and carbonated drinks. Alcohol use Do not drink alcohol if: Your health care provider tells you not to drink. You are pregnant, may be pregnant, or are planning to become pregnant. If you drink alcohol: Limit how much you have to: 0-1 drink a day for women. 0-2 drinks a day for men. Know how much alcohol is in your drink. In the [place] one drink equals one 12 oz bottle of beer (355 mL), one 5 oz glass of wine (148 mL), or one 1 oz glass of hard liquor (44 mL) General instructions Take over-the-counter and prescription medicines only as told by your health care provider. This includes supplements. Get enough exercise. Do at least 150 minutes of  moderate-intensity exercise each week. Manage your stress. Getting enough sleep and exercise can help you manage stress. Keep all follow-up visits. This is important. This includes all visits with your health care provider and therapist. Where to find more information International Foundation for Functional Gastrointestinal Disorders: aboutibs.[employer] of Diabetes and Digestive and Kidney Diseases: StageSync[employer]si Contact a health care provider if: You have constant pain. You lose weight. You have diarrhea that gets worse. You have bleeding from the rectum. You vomit often. You have a fever. Get help right away if: You have severe abdominal pain. You have diarrhea with symptoms of dehydration, such as dizziness or dry mouth. You have bloody or black stools. You have severe abdominal bloating. You have vomiting that does not stop. You have blood in your vomit. Summary Irritable bowel syndrome (IBS) is not one specific disease. It is a group of symptoms that affects digestion. Your intestines may become more sensitive and overreact to certain things. This may be especially true when you eat certain foods or when you are under stress. There is no cure for IBS, but treatment can help relieve symptoms. This information is not intended to replace advice given to you by your health care provider. Make sure you discuss any questions you have with your health care provider. Document Revised: 11/08/2021 Document Reviewed: 11/08/2021 Elsevier Patient Education  2024 Elsevier Inc. Low-FODMAP Eating Plan  FODMAP stands for fermentable oligosaccharides, disaccharides, monosaccharides, and polyols. These are sugars that are hard for some people to digest. A low-FODMAP eating plan may help some people who have irritable bowel syndrome (IBS) and certain other bowel (intestinal) diseases to manage their symptoms. This meal plan can be complicated to follow. Work with a diet and nutrition  specialist (dietitian) to make a low-FODMAP eating plan that is right for you. A dietitian can help make sure that you get enough nutrition from this diet. What are tips for following this plan? Reading food labels Check labels for hidden FODMAPs such as: High-fructose syrup. Honey. Agave. Natural fruit flavors. Onion or garlic powder. Choose low-FODMAP foods that contain 3-4 grams of fiber per serving. Check food labels for serving sizes. Eat only one serving at a time to make sure FODMAP levels stay low. Shopping Shop with a list of foods that are recommended on this diet and make a meal plan. Meal planning Follow a low-FODMAP eating plan for up to 6  weeks, or as told by your health care provider or dietitian. To follow the eating plan: Eliminate high-FODMAP foods from your diet completely. Choose only low-FODMAP foods to eat. You will do this for 2-6 weeks. Gradually reintroduce high-FODMAP foods into your diet one at a time. Most people should wait a few days before introducing the next new high-FODMAP food into their meal plan. Your dietitian can recommend how quickly you may reintroduce foods. Keep a daily record of what and how much you eat and drink. Make note of any symptoms that you have after eating. Review your daily record with a dietitian regularly to identify which foods you can eat and which foods you should avoid. General tips Drink enough fluid each day to keep your urine pale yellow. Avoid processed foods. These often have added sugar and may be high in FODMAPs. Avoid most dairy products, whole grains, and sweeteners. Work with a dietitian to make sure you get enough fiber in your diet. Avoid high FODMAP foods at meals to manage symptoms. Recommended foods Fruits Bananas, oranges, tangerines, lemons, limes, blueberries, raspberries, strawberries, grapes, cantaloupe, honeydew melon, kiwi, papaya, passion fruit, and pineapple. Limited amounts of dried cranberries, banana  chips, and shredded coconut. Vegetables Eggplant, zucchini, cucumber, peppers, green beans, bean sprouts, lettuce, arugula, kale, Swiss chard, spinach, collard greens, bok choy, summer squash, potato, and tomato. Limited amounts of corn, carrot, and sweet potato. Green parts of scallions. Grains Gluten-free grains, such as rice, oats, buckwheat, quinoa, corn, polenta, and millet. Gluten-free pasta, bread, or cereal. Rice noodles. Corn tortillas. Meats and other proteins Unseasoned beef, pork, poultry, or fish. Eggs. [place]. Tofu (firm) and tempeh. Limited amounts of nuts and seeds, such as almonds, walnuts, [place] nuts, pecans, peanuts, nut butters, pumpkin seeds, chia seeds, and sunflower seeds. Dairy Lactose-free milk, yogurt, and kefir. Lactose-free cottage cheese and ice cream. Non-dairy milks, such as almond, coconut, hemp, and rice milk. Non-dairy yogurt. Limited amounts of goat cheese, brie, mozzarella, parmesan, swiss, and other hard cheeses. Fats and oils Butter-free spreads. Vegetable oils, such as olive, canola, and sunflower oil. Seasoning and other foods Artificial sweeteners with names that do not end in ol, such as aspartame, saccharine, and stevia. Maple syrup, white table sugar, raw sugar, brown sugar, and molasses. Mayonnaise, soy sauce, and tamari. Fresh basil, coriander, parsley, rosemary, and thyme. Beverages Water and mineral water. Sugar-sweetened soft drinks. Small amounts of orange juice or cranberry juice. Black and green tea. Most dry wines. Coffee. The items listed above may not be a complete list of foods and beverages you can eat. Contact a dietitian for more information. Foods to avoid Fruits Fresh, dried, and juiced forms of apple, pear, watermelon, peach, plum, cherries, apricots, blackberries, boysenberries, figs, nectarines, and mango. Avocado. Vegetables Chicory root, artichoke, asparagus, cabbage, snow peas, Brussels sprouts, broccoli, sugar snap peas,  mushrooms, celery, and cauliflower. Onions, garlic, leeks, and the white part of scallions. Grains Wheat, including kamut, durum, and semolina. Barley and bulgur. Couscous. Wheat-based cereals. Wheat noodles, bread, crackers, and pastries. Meats and other proteins Fried or fatty meat. Sausage. Cashews and pistachios. Soybeans, baked beans, black beans, chickpeas, kidney beans, fava beans, navy beans, lentils, black-eyed peas, and split peas. Dairy Milk, yogurt, ice cream, and soft cheese. Cream and sour cream. Milk-based sauces. Custard. Buttermilk. Soy milk. Seasoning and other foods Any sugar-free gum or candy. Foods that contain artificial sweeteners such as sorbitol, mannitol, isomalt, or xylitol. Foods that contain honey, high-fructose corn syrup, or agave. Bouillon, vegetable stock, beef  stock, and chicken stock. Garlic and onion powder. Condiments made with onion, such as hummus, chutney, pickles, relish, salad dressing, and salsa. Tomato paste. Beverages Chicory-based drinks. Coffee substitutes. Chamomile tea. Fennel tea. Sweet or fortified wines such as port or sherry. Diet soft drinks made with isomalt, mannitol, maltitol, sorbitol, or xylitol. Apple, pear, and mango juice. Juices with high-fructose corn syrup. The items listed above may not be a complete list of foods and beverages you should avoid. Contact a dietitian for more information. Summary FODMAP stands for fermentable oligosaccharides, disaccharides, monosaccharides, and polyols. These are sugars that are hard for some people to digest. A low-FODMAP eating plan is a short-term diet that helps to ease symptoms of certain bowel diseases. The eating plan usually lasts up to 6 weeks. After that, high-FODMAP foods are reintroduced gradually and one at a time. This can help you find out which foods may be causing symptoms. A low-FODMAP eating plan can be complicated. It is best to work with a dietitian who has experience with this type  of plan. This information is not intended to replace advice given to you by your health care provider. Make sure you discuss any questions you have with your health care provider. Document Revised: 11/10/2023 Document Reviewed: 11/10/2023 Elsevier Patient Education  2025 [employer].  The above assessment and management plan was discussed with the patient. The patient verbalized understanding of and has agreed to the management plan. Patient is aware to call the clinic if they develop any new symptoms or if symptoms persist or worsen. Patient is aware when to return to the clinic for a follow-up visit. Patient educated on when it is appropriate to go to the emergency department.  Alyssa Pavlicek [place] Thompson, DNP [facility] [address] [place], [place] [address] [phone]

## 2024-09-07 ENCOUNTER — Ambulatory Visit: Payer: Self-pay | Admitting: Nurse Practitioner

## 2024-11-24 ENCOUNTER — Ambulatory Visit (INDEPENDENT_AMBULATORY_CARE_PROVIDER_SITE_OTHER): Payer: Self-pay | Admitting: Family Medicine

## 2024-11-24 ENCOUNTER — Other Ambulatory Visit (HOSPITAL_COMMUNITY)
Admission: RE | Admit: 2024-11-24 | Discharge: 2024-11-24 | Disposition: A | Source: Ambulatory Visit | Attending: Family Medicine | Admitting: Family Medicine

## 2024-11-24 ENCOUNTER — Encounter: Payer: Self-pay | Admitting: Family Medicine

## 2024-11-24 VITALS — BP 110/75 | HR 58 | Temp 97.2°F | Ht 64.0 in | Wt 190.0 lb

## 2024-11-24 DIAGNOSIS — M5416 Radiculopathy, lumbar region: Secondary | ICD-10-CM

## 2024-11-24 DIAGNOSIS — K582 Mixed irritable bowel syndrome: Secondary | ICD-10-CM | POA: Diagnosis not present

## 2024-11-24 DIAGNOSIS — Z79899 Other long term (current) drug therapy: Secondary | ICD-10-CM

## 2024-11-24 DIAGNOSIS — Z0000 Encounter for general adult medical examination without abnormal findings: Secondary | ICD-10-CM | POA: Insufficient documentation

## 2024-11-24 DIAGNOSIS — E782 Mixed hyperlipidemia: Secondary | ICD-10-CM

## 2024-11-24 DIAGNOSIS — E559 Vitamin D deficiency, unspecified: Secondary | ICD-10-CM

## 2024-11-24 DIAGNOSIS — Z01411 Encounter for gynecological examination (general) (routine) with abnormal findings: Secondary | ICD-10-CM

## 2024-11-24 DIAGNOSIS — Z01419 Encounter for gynecological examination (general) (routine) without abnormal findings: Secondary | ICD-10-CM

## 2024-11-24 DIAGNOSIS — Z23 Encounter for immunization: Secondary | ICD-10-CM

## 2024-11-24 DIAGNOSIS — M778 Other enthesopathies, not elsewhere classified: Secondary | ICD-10-CM

## 2024-11-24 MED ORDER — DICYCLOMINE HCL 20 MG PO TABS: ORAL_TABLET | ORAL | 1 refills | Status: AC

## 2024-11-24 MED ORDER — PREGABALIN 300 MG PO CAPS: 300.0000 mg | ORAL_CAPSULE | Freq: Every day | ORAL | 1 refills | Status: AC

## 2024-11-24 MED ORDER — TRIAMCINOLONE ACETONIDE 0.1 % EX CREA: 1.0000 | TOPICAL_CREAM | Freq: Three times a day (TID) | CUTANEOUS | 0 refills | Status: AC

## 2024-11-24 MED ORDER — NABUMETONE 500 MG PO TABS: 1000.0000 mg | ORAL_TABLET | Freq: Two times a day (BID) | ORAL | 1 refills | Status: DC

## 2024-11-24 MED ORDER — DICLOFENAC SODIUM 75 MG PO TBEC: 75.0000 mg | DELAYED_RELEASE_TABLET | Freq: Two times a day (BID) | ORAL | 2 refills | Status: AC

## 2024-11-24 NOTE — Progress Notes (Signed)
 Subjective:  Patient ID: Alyssa Arellano, female    DOB: 03-17-1976  Age: 48 y.o. MRN: [id number]  CC: Annual Exam, Arm Pain (Right arm pain around the elbow for the past couple weeks. No known injury. Hurts to pick up something heavy and arm feels weak. Some popping with movement. ), and Sinus Problem (Sinus drainage with blood in it. Ongoing for a while. Only bothersome at night while laying down. No treatment. )   HPI  Discussed the use of AI scribe software for clinical note transcription with the patient, who gave verbal consent to proceed.  History of Present Illness Alyssa Arellano is a 48 year old female who presents for an annual physical exam and reports right elbow pain.  She experiences pain in her right elbow, particularly when lifting objects, which she attributes to activities such as cleaning the house and washing dishes. There is no history of specific injury to the elbow.  She has cold sensitivity in her hands and feet, especially during the fall and winter months. Her hands become wrinkled and slightly red when exposed to cold. She uses gloves to keep her hands warm and applies Eucerin moisturizer to manage dryness.  She is currently taking omeprazole  twice a day for heartburn, which effectively controls her symptoms, eliminating the need for Tums. She takes pregabalin  300 mg at bedtime for pain reduction, particularly for her back and left knee pain. She also uses fluticasone  nasal spray for drainage, hydrochlorothiazide  for fluid and blood pressure management, and tizanidine  as a muscle relaxer for her back. She occasionally uses Miralax  for constipation and continues to take nabumetone  for pain management.  She has regular menstrual periods and is not taking birth control.          11/24/2024    8:59 AM 11/20/2023    9:30 AM 11/20/2023    9:09 AM  Depression screen PHQ 2/9  Decreased Interest 0 1 0  Down, Depressed, Hopeless 0 1 0  PHQ - 2 Score 0 2 0   Altered sleeping 0 1   Tired, decreased energy 0 1   Change in appetite 0 1   Feeling bad or failure about yourself  0 1   Trouble concentrating 0 1   Moving slowly or fidgety/restless 0 0   Suicidal thoughts 0 0   PHQ-9 Score 0 7    Difficult doing work/chores Not difficult at all Somewhat difficult      Data saved with a previous flowsheet row definition    History Alyssa Arellano has a past medical history of Anxiety, GERD (gastroesophageal reflux disease), IBS (irritable bowel syndrome), PONV (postoperative nausea and vomiting), and Skin disorder.   She has a past surgical history that includes Cesarean section (2006); Ganglion cyst excision (Right, 12/18/2013); Tubal ligation; and Dilatation and curettage/hysteroscopy with minerva (N/A, 06/28/2021).   Her family history includes Cancer in her father, maternal grandmother, paternal grandfather, and paternal grandmother; Hypertension in her brother and brother; Seizures in her sister.She reports that she has never smoked. She has never used smokeless tobacco. She reports that she does not drink alcohol and does not use drugs.    ROS Review of Systems  Constitutional:  Negative for appetite change, chills, diaphoresis, fatigue, fever and unexpected weight change.  HENT:  Negative for congestion, ear pain, hearing loss, postnasal drip, rhinorrhea, sneezing, sore throat and trouble swallowing.   Eyes:  Negative for pain.  Respiratory:  Negative for cough, chest tightness and shortness of breath.  Cardiovascular:  Negative for chest pain and palpitations.  Gastrointestinal:  Negative for abdominal pain, constipation, diarrhea, nausea and vomiting.  Endocrine: Negative for cold intolerance, heat intolerance, polydipsia, polyphagia and polyuria.  Genitourinary:  Positive for vaginal bleeding (nml menstrual). Negative for dysuria, frequency, menstrual problem, pelvic pain, vaginal discharge and vaginal pain.  Musculoskeletal:  Negative for  arthralgias and joint swelling.  Skin:  Negative for rash.       Dry skin especially hands anf feet.   Allergic/Immunologic: Negative for environmental allergies.  Neurological:  Negative for dizziness, weakness, numbness and headaches.  Psychiatric/Behavioral:  Negative for agitation and dysphoric mood.     Objective:  BP 110/75   Pulse (!) 58   Temp (!) 97.2 F (36.2 C)   Ht 5' 4 (1.626 m)   Wt 190 lb (86.2 kg)   SpO2 97%   BMI 32.61 kg/m   BP Readings from Last 3 Encounters:  11/24/24 110/75  08/31/24 115/76  11/20/23 126/86    Wt Readings from Last 3 Encounters:  11/24/24 190 lb (86.2 kg)  08/31/24 189 lb 12.8 oz (86.1 kg)  11/20/23 201 lb 3.2 oz (91.3 kg)     Physical Exam Exam conducted with a chaperone present.  Constitutional:      General: She is not in acute distress.    Appearance: Normal appearance. She is well-developed.  HENT:     Head: Normocephalic and atraumatic.     Right Ear: External ear normal.     Left Ear: External ear normal.     Nose: Nose normal.  Eyes:     Conjunctiva/sclera: Conjunctivae normal.     Pupils: Pupils are equal, round, and reactive to light.  Neck:     Thyroid: No thyromegaly.  Cardiovascular:     Rate and Rhythm: Normal rate and regular rhythm.     Heart sounds: Normal heart sounds. No murmur heard. Pulmonary:     Effort: Pulmonary effort is normal. No respiratory distress.     Breath sounds: Normal breath sounds. No wheezing or rales.  Chest:  Breasts:    Breasts are symmetrical.     Right: No inverted nipple, mass or tenderness.     Left: No inverted nipple, mass or tenderness.  Abdominal:     General: Bowel sounds are normal. There is no distension or abdominal bruit.     Palpations: Abdomen is soft. There is no hepatomegaly, splenomegaly or mass.     Tenderness: There is no abdominal tenderness. Negative signs include Murphy's sign and McBurney's sign.  Genitourinary:    General: Normal vulva.     Exam  position: Lithotomy position.     Pubic Area: No rash.      Labia:        Right: No tenderness or lesion.        Left: No tenderness or lesion.      Cervix: Normal. No cervical motion tenderness, discharge, friability, lesion, erythema, cervical bleeding or eversion.  Musculoskeletal:        General: No tenderness. Normal range of motion.     Cervical back: Normal range of motion and neck supple.  Lymphadenopathy:     Cervical: No cervical adenopathy.     Lower Body: No right inguinal adenopathy. No left inguinal adenopathy.  Skin:    General: Skin is warm and dry.     Findings: No rash.  Neurological:     Mental Status: She is alert and oriented to person, place, and time.  Deep Tendon Reflexes: Reflexes are normal and symmetric.  Psychiatric:        Behavior: Behavior normal.        Thought Content: Thought content normal.        Judgment: Judgment normal.    Physical Exam GENERAL: Alert, cooperative, well developed, no acute distress HEENT: Normocephalic, normal oropharynx, moist mucous membranes CHEST: Clear to auscultation bilaterally, no wheezes, rhonchi, or crackles CARDIOVASCULAR: Normal heart rate and rhythm, S1 and S2 normal without murmurs, no carotid bruit ABDOMEN: Soft, non-tender, non-distended, without organomegaly, normal bowel sounds EXTREMITIES: No cyanosis or edema NEUROLOGICAL: Cranial nerves grossly intact, moves all extremities without gross motor or sensory deficit   Assessment & Plan:  Well adult exam -     CBC with Differential/Platelet  Vitamin D  deficiency -     CBC with Differential/Platelet -     VITAMIN D  25 Hydroxy (Vit-D Deficiency, Fractures)  Mixed hyperlipidemia -     Comprehensive metabolic panel with GFR -     Lipid panel  Controlled substance agreement signed -     ToxASSURE Select 13 (MW), Urine  Lumbar radiculopathy, chronic -     CBC with Differential/Platelet -     Pregabalin ; Take 1 capsule (300 mg total) by mouth at  bedtime.  Dispense: 90 capsule; Refill: 1  Irritable bowel syndrome with both constipation and diarrhea -     CBC with Differential/Platelet -     Dicyclomine  HCl; TAKE 1 TABLET BY MOUTH 4 TIMES DAILY - BEFORE MEALS AND AT BEDTIME.  Dispense: 360 tablet; Refill: 1  Encounter for immunization -     Flu vaccine trivalent PF, 6mos and older(Flulaval,Afluria,Fluarix,Fluzone)  Gynecologic exam normal  Tendonitis of elbow, right  Other orders -     Triamcinolone  Acetonide; Apply 1 Application topically 3 (three) times daily. Avoid face and genitalia  Dispense: 45 g; Refill: 0 -     Diclofenac  Sodium; Take 1 tablet (75 mg total) by mouth 2 (two) times daily. For right elbow pain  Dispense: 60 tablet; Refill: 2    Assessment and Plan Assessment & Plan Lateral epicondylitis, right elbow   Pain in the right elbow, likely due to tendinitis, is exacerbated by activities such as lifting and washing dishes. Prescribed medication to alleviate elbow pain over the next week or two.  Chronic lumbar radiculopathy   Chronic back pain is managed with pregabalin  and nabumetone , providing relief. Continue pregabalin  300 mg at bedtime and nabumetone  for pain management.  Pain in left knee   Left knee pain is managed with nabumetone , providing relief. Continue nabumetone  for knee pain management.  Dry skin of hands and feet   Chronic dry skin on hands and feet is exacerbated by cold weather and frequent exposure to hot water. Current use of Eucerin lotion may be insufficient. Prescribed a stronger moisturizing cream to be applied two to three times daily. Continue using Eucerin lotion between applications of the prescribed cream.  Gastroesophageal reflux disease   GERD is managed with omeprazole , taken twice daily, effectively controlling symptoms without the need for Tums. Continue omeprazole  twice daily.  Irritable bowel syndrome, mixed type   IBS is managed with Miralax  as needed, used occasionally.  Continue Miralax  as needed for constipation.  General Health Maintenance   Routine health maintenance was discussed, including Pap smear and eye exams. Pap smear was not performed recently, and she does not see an eye doctor annually.       Follow-up: Return in about 6 months (  around 05/25/2025).  Butler Der, M.D.

## 2024-11-24 NOTE — Addendum Note (Signed)
 Addended by: SANDA REA SQUIBB on: 11/24/2024 02:53 PM   Modules accepted: Orders

## 2024-11-25 ENCOUNTER — Ambulatory Visit: Payer: Self-pay | Admitting: Family Medicine

## 2024-11-25 LAB — COMPREHENSIVE METABOLIC PANEL WITH GFR
ALT: 10 IU/L (ref 0–32)
AST: 10 IU/L (ref 0–40)
Albumin: 4.1 g/dL (ref 3.9–4.9)
Alkaline Phosphatase: 66 IU/L (ref 41–116)
BUN/Creatinine Ratio: 12 (ref 9–23)
BUN: 13 mg/dL (ref 6–24)
Bilirubin Total: 0.7 mg/dL (ref 0.0–1.2)
CO2: 26 mmol/L (ref 20–29)
Calcium: 8.9 mg/dL (ref 8.7–10.2)
Chloride: 98 mmol/L (ref 96–106)
Creatinine, Ser: 1.13 mg/dL — ABNORMAL HIGH (ref 0.57–1.00)
Globulin, Total: 2.9 g/dL (ref 1.5–4.5)
Glucose: 99 mg/dL (ref 70–99)
Potassium: 3.7 mmol/L (ref 3.5–5.2)
Sodium: 137 mmol/L (ref 134–144)
Total Protein: 7 g/dL (ref 6.0–8.5)
eGFR: 61 mL/min/1.73 (ref >59)

## 2024-11-25 LAB — CBC WITH DIFFERENTIAL/PLATELET
Basophils Absolute: 0 x10E3/uL (ref 0.0–0.2)
Basos: 1 %
EOS (ABSOLUTE): 0.2 x10E3/uL (ref 0.0–0.4)
Eos: 4 %
Hematocrit: 43.7 % (ref 34.0–46.6)
Hemoglobin: 14 g/dL (ref 11.1–15.9)
Immature Grans (Abs): 0 x10E3/uL (ref 0.0–0.1)
Immature Granulocytes: 0 %
Lymphocytes Absolute: 1.6 x10E3/uL (ref 0.7–3.1)
Lymphs: 29 %
MCH: 28.4 pg (ref 26.6–33.0)
MCHC: 32 g/dL (ref 31.5–35.7)
MCV: 89 fL (ref 79–97)
Monocytes Absolute: 0.5 x10E3/uL (ref 0.1–0.9)
Monocytes: 9 %
Neutrophils Absolute: 3.3 x10E3/uL (ref 1.4–7.0)
Neutrophils: 57 %
Platelets: 215 x10E3/uL (ref 150–450)
RBC: 4.93 x10E6/uL (ref 3.77–5.28)
RDW: 13.2 % (ref 11.7–15.4)
WBC: 5.7 x10E3/uL (ref 3.4–10.8)

## 2024-11-25 LAB — LIPID PANEL
Chol/HDL Ratio: 4.1 ratio (ref 0.0–4.4)
Cholesterol, Total: 167 mg/dL (ref 100–199)
HDL: 41 mg/dL (ref >39)
LDL Chol Calc (NIH): 108 mg/dL — ABNORMAL HIGH (ref 0–99)
Triglycerides: 95 mg/dL (ref 0–149)
VLDL Cholesterol Cal: 18 mg/dL (ref 5–40)

## 2024-11-25 LAB — VITAMIN D 25 HYDROXY (VIT D DEFICIENCY, FRACTURES): Vit D, 25-Hydroxy: 36.5 ng/mL (ref 30.0–100.0)

## 2024-11-25 NOTE — Progress Notes (Signed)
Hello Emmalyn,  Your lab result is normal and/or stable.Some minor variations that are not significant are commonly marked abnormal, but do not represent any medical problem for you.  Best regards, Gaelen Brager, M.D.

## 2024-11-27 LAB — TOXASSURE SELECT 13 (MW), URINE

## 2024-11-30 LAB — CYTOLOGY - PAP
Chlamydia: NEGATIVE
Comment: NEGATIVE
Comment: NORMAL
Diagnosis: NEGATIVE
Neisseria Gonorrhea: NEGATIVE

## 2024-11-30 NOTE — Progress Notes (Signed)
 Your recent Pap smear performed in the office came back normal.

## 2024-12-11 ENCOUNTER — Other Ambulatory Visit: Payer: Self-pay | Admitting: Nurse Practitioner

## 2024-12-11 DIAGNOSIS — Z1231 Encounter for screening mammogram for malignant neoplasm of breast: Secondary | ICD-10-CM

## 2024-12-14 ENCOUNTER — Inpatient Hospital Stay: Admission: RE | Admit: 2024-12-14 | Source: Ambulatory Visit

## 2024-12-14 ENCOUNTER — Ambulatory Visit
Admission: RE | Admit: 2024-12-14 | Discharge: 2024-12-14 | Disposition: A | Discharge status: Home / Self Care | Source: Ambulatory Visit | Attending: Nurse Practitioner | Admitting: Nurse Practitioner

## 2024-12-14 DIAGNOSIS — Z1231 Encounter for screening mammogram for malignant neoplasm of breast: Secondary | ICD-10-CM

## 2024-12-16 ENCOUNTER — Ambulatory Visit: Payer: Self-pay | Admitting: Nurse Practitioner

## 2025-01-15 ENCOUNTER — Encounter: Payer: Self-pay | Admitting: *Deleted

## 2025-05-20 ENCOUNTER — Ambulatory Visit: Admitting: Family Medicine

## 2025-05-25 ENCOUNTER — Ambulatory Visit: Admitting: Family Medicine
